# Patient Record
Sex: Female | Born: 1945 | Race: White | Hispanic: No | Marital: Married | Smoking: Never smoker
Health system: Southern US, Community
[De-identification: ages and names within clinical notes are randomized; demographics above are authoritative.]

## PROBLEM LIST (undated history)

## (undated) DIAGNOSIS — I890 Lymphedema, not elsewhere classified: Secondary | ICD-10-CM

## (undated) DIAGNOSIS — I452 Bifascicular block: Secondary | ICD-10-CM

## (undated) DIAGNOSIS — K219 Gastro-esophageal reflux disease without esophagitis: Secondary | ICD-10-CM

## (undated) DIAGNOSIS — M4302 Spondylolysis, cervical region: Secondary | ICD-10-CM

## (undated) DIAGNOSIS — G4730 Sleep apnea, unspecified: Secondary | ICD-10-CM

## (undated) DIAGNOSIS — E669 Obesity, unspecified: Secondary | ICD-10-CM

## (undated) DIAGNOSIS — R51 Headache: Secondary | ICD-10-CM

## (undated) DIAGNOSIS — I447 Left bundle-branch block, unspecified: Secondary | ICD-10-CM

## (undated) DIAGNOSIS — Z87442 Personal history of urinary calculi: Secondary | ICD-10-CM

## (undated) DIAGNOSIS — N2581 Secondary hyperparathyroidism of renal origin: Secondary | ICD-10-CM

## (undated) DIAGNOSIS — H9190 Unspecified hearing loss, unspecified ear: Secondary | ICD-10-CM

## (undated) DIAGNOSIS — I5030 Unspecified diastolic (congestive) heart failure: Secondary | ICD-10-CM

## (undated) DIAGNOSIS — A419 Sepsis, unspecified organism: Secondary | ICD-10-CM

## (undated) DIAGNOSIS — D631 Anemia in chronic kidney disease: Secondary | ICD-10-CM

## (undated) DIAGNOSIS — I10 Essential (primary) hypertension: Secondary | ICD-10-CM

## (undated) DIAGNOSIS — I050 Rheumatic mitral stenosis: Secondary | ICD-10-CM

## (undated) DIAGNOSIS — N390 Urinary tract infection, site not specified: Secondary | ICD-10-CM

## (undated) DIAGNOSIS — E119 Type 2 diabetes mellitus without complications: Secondary | ICD-10-CM

## (undated) DIAGNOSIS — F329 Major depressive disorder, single episode, unspecified: Secondary | ICD-10-CM

## (undated) DIAGNOSIS — Z9841 Cataract extraction status, right eye: Secondary | ICD-10-CM

## (undated) DIAGNOSIS — R519 Headache, unspecified: Secondary | ICD-10-CM

## (undated) DIAGNOSIS — M1990 Unspecified osteoarthritis, unspecified site: Secondary | ICD-10-CM

## (undated) DIAGNOSIS — J45909 Unspecified asthma, uncomplicated: Secondary | ICD-10-CM

## (undated) DIAGNOSIS — F419 Anxiety disorder, unspecified: Secondary | ICD-10-CM

## (undated) DIAGNOSIS — I509 Heart failure, unspecified: Secondary | ICD-10-CM

## (undated) DIAGNOSIS — N184 Chronic kidney disease, stage 4 (severe): Secondary | ICD-10-CM

## (undated) DIAGNOSIS — Z9221 Personal history of antineoplastic chemotherapy: Secondary | ICD-10-CM

## (undated) DIAGNOSIS — C50919 Malignant neoplasm of unspecified site of unspecified female breast: Secondary | ICD-10-CM

## (undated) DIAGNOSIS — F32A Depression, unspecified: Secondary | ICD-10-CM

## (undated) DIAGNOSIS — I700 Atherosclerosis of aorta: Secondary | ICD-10-CM

## (undated) DIAGNOSIS — N1832 Chronic kidney disease, stage 3b: Secondary | ICD-10-CM

## (undated) DIAGNOSIS — R600 Localized edema: Secondary | ICD-10-CM

## (undated) DIAGNOSIS — R6520 Severe sepsis without septic shock: Secondary | ICD-10-CM

## (undated) DIAGNOSIS — N189 Chronic kidney disease, unspecified: Secondary | ICD-10-CM

## (undated) DIAGNOSIS — J3089 Other allergic rhinitis: Secondary | ICD-10-CM

## (undated) HISTORY — PX: REPLACEMENT TOTAL KNEE BILATERAL: SUR1225

## (undated) HISTORY — PX: MASTECTOMY, PARTIAL: SHX709

## (undated) HISTORY — PX: GASTRIC BYPASS: SHX52

## (undated) HISTORY — PX: APPENDECTOMY: SHX54

## (undated) HISTORY — DX: Heart failure, unspecified: I50.9

## (undated) HISTORY — PX: JOINT REPLACEMENT: SHX530

## (undated) HISTORY — PX: EYE SURGERY: SHX253

## (undated) HISTORY — PX: TUBAL LIGATION: SHX77

## (undated) HISTORY — PX: CHOLECYSTECTOMY: SHX55

## (undated) HISTORY — PX: OTHER SURGICAL HISTORY: SHX169

## (undated) HISTORY — DX: Chronic kidney disease, unspecified: N18.9

---

## 2001-02-10 DIAGNOSIS — C50919 Malignant neoplasm of unspecified site of unspecified female breast: Secondary | ICD-10-CM

## 2001-02-10 DIAGNOSIS — Z9221 Personal history of antineoplastic chemotherapy: Secondary | ICD-10-CM

## 2001-02-10 DIAGNOSIS — C50911 Malignant neoplasm of unspecified site of right female breast: Secondary | ICD-10-CM

## 2001-02-10 HISTORY — DX: Malignant neoplasm of unspecified site of right female breast: C50.911

## 2001-02-10 HISTORY — PX: BREAST EXCISIONAL BIOPSY: SUR124

## 2001-02-10 HISTORY — DX: Malignant neoplasm of unspecified site of unspecified female breast: C50.919

## 2001-02-10 HISTORY — DX: Personal history of antineoplastic chemotherapy: Z92.21

## 2003-12-26 ENCOUNTER — Ambulatory Visit: Payer: Self-pay | Admitting: Oncology

## 2004-01-11 ENCOUNTER — Ambulatory Visit: Payer: Self-pay | Admitting: Oncology

## 2004-02-26 ENCOUNTER — Ambulatory Visit: Payer: Self-pay | Admitting: Oncology

## 2004-06-10 DIAGNOSIS — Z9884 Bariatric surgery status: Secondary | ICD-10-CM

## 2004-06-10 HISTORY — DX: Bariatric surgery status: Z98.84

## 2004-06-10 HISTORY — PX: GASTRIC BYPASS: SHX52

## 2004-06-28 ENCOUNTER — Ambulatory Visit: Payer: Self-pay | Admitting: Oncology

## 2004-07-11 ENCOUNTER — Ambulatory Visit: Payer: Self-pay | Admitting: Oncology

## 2004-12-06 ENCOUNTER — Emergency Department: Payer: Self-pay | Admitting: Emergency Medicine

## 2004-12-06 ENCOUNTER — Other Ambulatory Visit: Payer: Self-pay

## 2004-12-23 ENCOUNTER — Ambulatory Visit: Payer: Self-pay | Admitting: Oncology

## 2005-01-09 ENCOUNTER — Encounter: Payer: Self-pay | Admitting: Gynecology

## 2005-01-10 ENCOUNTER — Encounter: Payer: Self-pay | Admitting: Gynecology

## 2005-01-10 ENCOUNTER — Ambulatory Visit: Payer: Self-pay | Admitting: Oncology

## 2005-02-10 ENCOUNTER — Encounter: Payer: Self-pay | Admitting: Gynecology

## 2005-03-03 ENCOUNTER — Ambulatory Visit: Payer: Self-pay | Admitting: Oncology

## 2005-03-13 ENCOUNTER — Encounter: Payer: Self-pay | Admitting: Gynecology

## 2005-04-10 ENCOUNTER — Encounter: Payer: Self-pay | Admitting: Gynecology

## 2005-05-05 ENCOUNTER — Ambulatory Visit: Payer: Self-pay | Admitting: Unknown Physician Specialty

## 2005-05-11 ENCOUNTER — Encounter: Payer: Self-pay | Admitting: Gynecology

## 2005-06-16 ENCOUNTER — Ambulatory Visit: Payer: Self-pay | Admitting: Oncology

## 2005-07-11 ENCOUNTER — Ambulatory Visit: Payer: Self-pay | Admitting: Oncology

## 2005-07-21 ENCOUNTER — Inpatient Hospital Stay: Payer: Self-pay | Admitting: General Practice

## 2005-07-25 ENCOUNTER — Ambulatory Visit: Payer: Self-pay | Admitting: Physical Medicine & Rehabilitation

## 2005-07-25 ENCOUNTER — Inpatient Hospital Stay (HOSPITAL_COMMUNITY)
Admission: RE | Admit: 2005-07-25 | Discharge: 2005-07-31 | Payer: Self-pay | Admitting: Physical Medicine & Rehabilitation

## 2005-12-15 ENCOUNTER — Ambulatory Visit: Payer: Self-pay | Admitting: Oncology

## 2006-01-10 ENCOUNTER — Ambulatory Visit: Payer: Self-pay | Admitting: Oncology

## 2006-03-05 ENCOUNTER — Ambulatory Visit: Payer: Self-pay | Admitting: Oncology

## 2006-06-11 ENCOUNTER — Ambulatory Visit: Payer: Self-pay | Admitting: Oncology

## 2006-06-15 ENCOUNTER — Ambulatory Visit: Payer: Self-pay | Admitting: Oncology

## 2006-07-12 ENCOUNTER — Ambulatory Visit: Payer: Self-pay | Admitting: Oncology

## 2006-12-12 ENCOUNTER — Ambulatory Visit: Payer: Self-pay | Admitting: Oncology

## 2006-12-14 ENCOUNTER — Ambulatory Visit: Payer: Self-pay | Admitting: Oncology

## 2007-01-11 ENCOUNTER — Ambulatory Visit: Payer: Self-pay | Admitting: Oncology

## 2007-02-11 ENCOUNTER — Ambulatory Visit: Payer: Self-pay | Admitting: Oncology

## 2007-03-08 ENCOUNTER — Ambulatory Visit: Payer: Self-pay | Admitting: Oncology

## 2007-03-14 ENCOUNTER — Ambulatory Visit: Payer: Self-pay | Admitting: Emergency Medicine

## 2007-06-03 ENCOUNTER — Ambulatory Visit: Payer: Self-pay | Admitting: Internal Medicine

## 2007-06-11 ENCOUNTER — Ambulatory Visit: Payer: Self-pay | Admitting: Oncology

## 2007-06-14 ENCOUNTER — Ambulatory Visit: Payer: Self-pay | Admitting: Oncology

## 2007-07-12 ENCOUNTER — Ambulatory Visit: Payer: Self-pay | Admitting: Oncology

## 2007-12-13 ENCOUNTER — Ambulatory Visit: Payer: Self-pay | Admitting: Oncology

## 2008-01-11 ENCOUNTER — Ambulatory Visit: Payer: Self-pay | Admitting: Oncology

## 2008-03-08 ENCOUNTER — Ambulatory Visit: Payer: Self-pay | Admitting: Oncology

## 2008-04-28 ENCOUNTER — Ambulatory Visit: Payer: Self-pay | Admitting: Gastroenterology

## 2008-06-10 ENCOUNTER — Ambulatory Visit: Payer: Self-pay | Admitting: Oncology

## 2008-06-12 ENCOUNTER — Ambulatory Visit: Payer: Self-pay | Admitting: Oncology

## 2008-07-11 ENCOUNTER — Ambulatory Visit: Payer: Self-pay | Admitting: Oncology

## 2009-04-04 ENCOUNTER — Ambulatory Visit: Payer: Self-pay

## 2009-04-10 ENCOUNTER — Ambulatory Visit: Payer: Self-pay | Admitting: Oncology

## 2009-04-16 ENCOUNTER — Ambulatory Visit: Payer: Self-pay | Admitting: Oncology

## 2009-05-11 ENCOUNTER — Ambulatory Visit: Payer: Self-pay | Admitting: Oncology

## 2010-04-11 ENCOUNTER — Ambulatory Visit: Payer: Self-pay | Admitting: Oncology

## 2010-06-18 ENCOUNTER — Ambulatory Visit: Payer: Self-pay | Admitting: Oncology

## 2010-07-12 ENCOUNTER — Ambulatory Visit: Payer: Self-pay | Admitting: Oncology

## 2011-04-15 ENCOUNTER — Ambulatory Visit: Payer: Self-pay | Admitting: Oncology

## 2011-06-30 ENCOUNTER — Ambulatory Visit: Payer: Self-pay | Admitting: Oncology

## 2011-07-12 ENCOUNTER — Ambulatory Visit: Payer: Self-pay | Admitting: Oncology

## 2012-03-15 ENCOUNTER — Ambulatory Visit: Payer: Self-pay | Admitting: Otolaryngology

## 2012-06-01 ENCOUNTER — Ambulatory Visit: Payer: Self-pay | Admitting: Internal Medicine

## 2013-06-02 ENCOUNTER — Ambulatory Visit: Payer: Self-pay | Admitting: Family Medicine

## 2014-06-02 NOTE — Op Note (Signed)
PATIENT NAME:  Veronica Graham, Veronica Graham MR#:  [id number] DATE OF BIRTH:  [birth year]  DATE OF PROCEDURE:  03/15/2012  PREOPERATIVE DIAGNOSES: 1.  Septal deviation.  2.  Turbinate enlargement.  3.  Airway obstruction.   POSTOPERATIVE DIAGNOSIS:  1.  Septal deviation.  2.  Turbinate enlargement.  3.  Airway obstruction.   OPERATIVE PROCEDURES: 1.  Septoplasty.  2.  Bilateral outfracture of the inferior turbinates.   SURGEON:  Huey Romans, MD  ANESTHESIA: General.   COMPLICATIONS: None.   DESCRIPTION OF PROCEDURE: The patient was given general anesthesia by oral endotracheal intubation. The nose was visualized. You could see the septum deviated towards the right side posteriorly with very large bony spur obstructing the airway there.  The inferior turbinates were enlarged as well. The area was narrow actually on both sides. The nose was prepped using 6 mL of 1% Xylocaine with epinephrine 1:200,000 for infiltration of the nasal septum and lateral nasal walls. The nose was then packed with Cottonoid pledgets soaked in phenylephrine and Xylocaine as per normal.  She was  prepped and draped in sterile fashion.   The cotton pledgets were removed. The nasal passages were visualized. The inferior turbinates were blocking much of the airway on both sides. A [place] elevator was used for outfracturing the inferior turbinates to increase the airway and open up visualization of the septum better.  This was done bilaterally.  A left [place] incision was created with elevation of mucoperichondrium on the left side of the quadrangular plate. The bony cartilaginous junction was split and some of the posterior inferior quadrangular plate was removed. There was a very large portion of the quadrangular plate that was left intact. Inferiorly, the vomer and maxillary crest was buckled onto the right side. Some of the overhanging cartilage was removed and some of the maxillary crest on the right side was trimmed and  removed that was hanging out. The mucoperiosteum was elevated on both sides of the maxillary crest and vomer and the ethmoid plate.  You can see the very large septal spur to the right side from the vomer. This was fractured and removed as well. The remaining bony tissue was left intact as it seems to be sitting in good position in the nose. The mucosal flaps were placed back in their anatomic position. There was now a nice open airway on both sides of the nose with no evidence for obstruction. The mucosal flaps were sutured in position with a 4-0 plain gut suture in a through and through whipstitch fashion. This was used to close [place] incision as well. There was 1 small tear the mucosa where the large spur was sticking out at the vomer, posteriorly on the right knee.   The area was opened and suture was stuck together with nasal splints using Xomed 0.5 mm regular size splints that were placed on both sides of the nasal septum and held in place with a 3-0 nylon through and through suture. The patient tolerated the procedure well. She was awakened and taken to the recovery room in satisfactory condition. There were no operative complications.    ____________________________ Huey Romans, MD phj:ct D: 03/15/2012 08:54:55 ET T: 03/15/2012 12:03:04 ET JOB#: [id number]  cc: Huey Romans, MD, <Dictator> Huey Romans MD ELECTRONICALLY SIGNED 03/23/2012 8:12

## 2014-06-08 ENCOUNTER — Ambulatory Visit
Admit: 2014-06-08 | Disposition: A | Discharge status: Home / Self Care | Payer: Self-pay | Attending: Family Medicine | Admitting: Family Medicine

## 2015-05-14 ENCOUNTER — Other Ambulatory Visit: Payer: Self-pay | Admitting: Family Medicine

## 2015-05-14 DIAGNOSIS — Z1231 Encounter for screening mammogram for malignant neoplasm of breast: Secondary | ICD-10-CM

## 2015-06-12 ENCOUNTER — Other Ambulatory Visit: Payer: Self-pay | Admitting: Family Medicine

## 2015-06-12 ENCOUNTER — Ambulatory Visit
Admission: RE | Admit: 2015-06-12 | Discharge: 2015-06-12 | Disposition: A | Discharge status: Home / Self Care | Payer: Medicare Other | Source: Ambulatory Visit | Attending: Family Medicine | Admitting: Family Medicine

## 2015-06-12 DIAGNOSIS — Z1231 Encounter for screening mammogram for malignant neoplasm of breast: Secondary | ICD-10-CM | POA: Diagnosis present

## 2015-06-12 HISTORY — DX: Malignant neoplasm of unspecified site of unspecified female breast: C50.919

## 2016-04-29 ENCOUNTER — Other Ambulatory Visit: Payer: Self-pay | Admitting: Family Medicine

## 2016-04-29 DIAGNOSIS — Z1231 Encounter for screening mammogram for malignant neoplasm of breast: Secondary | ICD-10-CM

## 2016-05-13 ENCOUNTER — Ambulatory Visit: Payer: Medicare Other | Attending: Neurology

## 2016-05-13 DIAGNOSIS — G4733 Obstructive sleep apnea (adult) (pediatric): Secondary | ICD-10-CM | POA: Diagnosis present

## 2016-06-12 ENCOUNTER — Ambulatory Visit
Admission: RE | Admit: 2016-06-12 | Discharge: 2016-06-12 | Disposition: A | Discharge status: Home / Self Care | Payer: Medicare Other | Source: Ambulatory Visit | Attending: Family Medicine | Admitting: Family Medicine

## 2016-06-12 DIAGNOSIS — Z1231 Encounter for screening mammogram for malignant neoplasm of breast: Secondary | ICD-10-CM | POA: Diagnosis not present

## 2016-06-27 ENCOUNTER — Other Ambulatory Visit: Payer: Self-pay | Admitting: Physician Assistant

## 2016-06-27 DIAGNOSIS — M5030 Other cervical disc degeneration, unspecified cervical region: Secondary | ICD-10-CM

## 2016-07-10 ENCOUNTER — Ambulatory Visit
Admission: RE | Admit: 2016-07-10 | Discharge: 2016-07-10 | Disposition: A | Discharge status: Home / Self Care | Payer: Medicare Other | Source: Ambulatory Visit | Attending: Physician Assistant | Admitting: Physician Assistant

## 2016-07-10 DIAGNOSIS — M50221 Other cervical disc displacement at C4-C5 level: Secondary | ICD-10-CM | POA: Diagnosis not present

## 2016-07-10 DIAGNOSIS — M4802 Spinal stenosis, cervical region: Secondary | ICD-10-CM | POA: Insufficient documentation

## 2016-07-10 DIAGNOSIS — M5030 Other cervical disc degeneration, unspecified cervical region: Secondary | ICD-10-CM | POA: Insufficient documentation

## 2017-03-25 ENCOUNTER — Encounter: Payer: Self-pay | Admitting: *Deleted

## 2017-03-31 ENCOUNTER — Encounter
Admission: RE | Disposition: A | Discharge status: Home / Self Care | Payer: Self-pay | Source: Ambulatory Visit | Attending: Ophthalmology

## 2017-03-31 ENCOUNTER — Encounter: Payer: Self-pay | Admitting: *Deleted

## 2017-03-31 ENCOUNTER — Ambulatory Visit: Payer: Medicare Other | Admitting: Certified Registered Nurse Anesthetist

## 2017-03-31 ENCOUNTER — Other Ambulatory Visit: Payer: Self-pay

## 2017-03-31 ENCOUNTER — Ambulatory Visit
Admission: RE | Admit: 2017-03-31 | Discharge: 2017-03-31 | Disposition: A | Discharge status: Home / Self Care | Payer: Medicare Other | Source: Ambulatory Visit | Attending: Ophthalmology | Admitting: Ophthalmology

## 2017-03-31 DIAGNOSIS — J45909 Unspecified asthma, uncomplicated: Secondary | ICD-10-CM | POA: Insufficient documentation

## 2017-03-31 DIAGNOSIS — Z7951 Long term (current) use of inhaled steroids: Secondary | ICD-10-CM | POA: Insufficient documentation

## 2017-03-31 DIAGNOSIS — Z9884 Bariatric surgery status: Secondary | ICD-10-CM | POA: Diagnosis not present

## 2017-03-31 DIAGNOSIS — F329 Major depressive disorder, single episode, unspecified: Secondary | ICD-10-CM | POA: Diagnosis not present

## 2017-03-31 DIAGNOSIS — Z96653 Presence of artificial knee joint, bilateral: Secondary | ICD-10-CM | POA: Diagnosis not present

## 2017-03-31 DIAGNOSIS — Z6841 Body Mass Index (BMI) 40.0 and over, adult: Secondary | ICD-10-CM | POA: Diagnosis not present

## 2017-03-31 DIAGNOSIS — Z853 Personal history of malignant neoplasm of breast: Secondary | ICD-10-CM | POA: Insufficient documentation

## 2017-03-31 DIAGNOSIS — H2511 Age-related nuclear cataract, right eye: Secondary | ICD-10-CM | POA: Diagnosis present

## 2017-03-31 DIAGNOSIS — I10 Essential (primary) hypertension: Secondary | ICD-10-CM | POA: Insufficient documentation

## 2017-03-31 DIAGNOSIS — G4730 Sleep apnea, unspecified: Secondary | ICD-10-CM | POA: Diagnosis not present

## 2017-03-31 DIAGNOSIS — Z79899 Other long term (current) drug therapy: Secondary | ICD-10-CM | POA: Insufficient documentation

## 2017-03-31 HISTORY — DX: Sleep apnea, unspecified: G47.30

## 2017-03-31 HISTORY — DX: Localized edema: R60.0

## 2017-03-31 HISTORY — DX: Headache: R51

## 2017-03-31 HISTORY — DX: Headache, unspecified: R51.9

## 2017-03-31 HISTORY — PX: CATARACT EXTRACTION W/PHACO: SHX586

## 2017-03-31 HISTORY — DX: Essential (primary) hypertension: I10

## 2017-03-31 HISTORY — DX: Major depressive disorder, single episode, unspecified: F32.9

## 2017-03-31 HISTORY — DX: Unspecified hearing loss, unspecified ear: H91.90

## 2017-03-31 HISTORY — DX: Depression, unspecified: F32.A

## 2017-03-31 HISTORY — DX: Unspecified asthma, uncomplicated: J45.909

## 2017-03-31 HISTORY — DX: Other allergic rhinitis: J30.89

## 2017-03-31 SURGERY — PHACOEMULSIFICATION, CATARACT, WITH IOL INSERTION
Anesthesia: Monitor Anesthesia Care | Site: Eye | Laterality: Right | Wound class: Clean

## 2017-03-31 MED ORDER — EPINEPHRINE PF 1 MG/ML IJ SOLN
INTRAOCULAR | Status: DC | PRN
  Administered 2017-03-31: 13:00:00 via OPHTHALMIC

## 2017-03-31 MED ORDER — NA CHONDROIT SULF-NA HYALURON 40-17 MG/ML IO SOLN
INTRAOCULAR | Status: AC
Start: 2017-03-31 — End: 2017-03-31
  Filled 2017-03-31: qty 1

## 2017-03-31 MED ORDER — CARBACHOL 0.01 % IO SOLN
INTRAOCULAR | Status: DC | PRN
  Administered 2017-03-31: 0.5 mL via INTRAOCULAR

## 2017-03-31 MED ORDER — LIDOCAINE HCL (PF) 4 % IJ SOLN
INTRAOCULAR | Status: DC | PRN
  Administered 2017-03-31: 4 mL via OPHTHALMIC

## 2017-03-31 MED ORDER — MOXIFLOXACIN HCL 0.5 % OP SOLN: 1.0000 [drp] | OPHTHALMIC | Status: DC | PRN

## 2017-03-31 MED ORDER — MOXIFLOXACIN HCL 0.5 % OP SOLN
OPHTHALMIC | Status: AC
  Filled 2017-03-31: qty 3

## 2017-03-31 MED ORDER — ARMC OPHTHALMIC DILATING DROPS
1.0000 "application " | OPHTHALMIC | Status: AC
  Administered 2017-03-31 (×3): 1 via OPHTHALMIC

## 2017-03-31 MED ORDER — POVIDONE-IODINE 5 % OP SOLN
OPHTHALMIC | Status: AC
Start: 2017-03-31 — End: 2017-03-31
  Filled 2017-03-31: qty 30

## 2017-03-31 MED ORDER — POVIDONE-IODINE 5 % OP SOLN
OPHTHALMIC | Status: DC | PRN
  Administered 2017-03-31: 1 via OPHTHALMIC

## 2017-03-31 MED ORDER — LIDOCAINE HCL (PF) 4 % IJ SOLN
INTRAMUSCULAR | Status: AC
  Filled 2017-03-31: qty 5

## 2017-03-31 MED ORDER — SODIUM CHLORIDE 0.9 % IV SOLN
INTRAVENOUS | Status: DC
  Administered 2017-03-31: 12:00:00 via INTRAVENOUS

## 2017-03-31 MED ORDER — MIDAZOLAM HCL 2 MG/2ML IJ SOLN
INTRAMUSCULAR | Status: DC | PRN
  Administered 2017-03-31: 1 mg via INTRAVENOUS

## 2017-03-31 MED ORDER — ARMC OPHTHALMIC DILATING DROPS
OPHTHALMIC | Status: AC
  Administered 2017-03-31: 1 via OPHTHALMIC
  Filled 2017-03-31: qty 0.4

## 2017-03-31 MED ORDER — MOXIFLOXACIN HCL 0.5 % OP SOLN
OPHTHALMIC | Status: DC | PRN
  Administered 2017-03-31: 0.2 mL via OPHTHALMIC

## 2017-03-31 MED ORDER — NA CHONDROIT SULF-NA HYALURON 40-17 MG/ML IO SOLN
INTRAOCULAR | Status: DC | PRN
  Administered 2017-03-31: 1 mL via INTRAOCULAR

## 2017-03-31 MED ORDER — MIDAZOLAM HCL 2 MG/2ML IJ SOLN
INTRAMUSCULAR | Status: AC
  Filled 2017-03-31: qty 2

## 2017-03-31 MED ORDER — EPINEPHRINE PF 1 MG/ML IJ SOLN
INTRAMUSCULAR | Status: AC
Start: 2017-03-31 — End: 2017-03-31
  Filled 2017-03-31: qty 2

## 2017-03-31 SURGICAL SUPPLY — 16 items
GLOVE BIO SURGEON STRL SZ8 (GLOVE) ×3 IMPLANT
GLOVE BIOGEL M 6.5 STRL (GLOVE) ×3 IMPLANT
GLOVE SURG LX 8.0 MICRO (GLOVE) ×2
GLOVE SURG LX STRL 8.0 MICRO (GLOVE) ×1 IMPLANT
GOWN STRL REUS W/ TWL LRG LVL3 (GOWN DISPOSABLE) ×2 IMPLANT
GOWN STRL REUS W/TWL LRG LVL3 (GOWN DISPOSABLE) ×4
LABEL CATARACT MEDS ST (LABEL) ×3 IMPLANT
LENS IOL TECNIS ITEC 25.5 (Intraocular Lens) ×3 IMPLANT
PACK CATARACT (MISCELLANEOUS) ×3 IMPLANT
PACK CATARACT BRASINGTON LX (MISCELLANEOUS) ×3 IMPLANT
PACK EYE AFTER SURG (MISCELLANEOUS) ×3 IMPLANT
SOL BSS BAG (MISCELLANEOUS) ×3
SOLUTION BSS BAG (MISCELLANEOUS) ×1 IMPLANT
SYR 5ML LL (SYRINGE) ×3 IMPLANT
WATER STERILE IRR 250ML POUR (IV SOLUTION) ×3 IMPLANT
WIPE NON LINTING 3.25X3.25 (MISCELLANEOUS) ×3 IMPLANT

## 2017-03-31 NOTE — Transfer of Care (Signed)
Immediate Anesthesia Transfer of Care Note  Patient: Veronica Graham  Procedure(s) Performed: CATARACT EXTRACTION PHACO AND INTRAOCULAR LENS PLACEMENT (IOC) (Right Eye)  Patient Location: PACU  Anesthesia Type:MAC  Level of Consciousness: awake, alert  and oriented  Airway & Oxygen Therapy: Patient Spontanous Breathing  Post-op Assessment: Report given to RN and Post -op Vital signs reviewed and stable  Post vital signs: Reviewed and stable  Last Vitals:  Vitals:   03/25/17 1004 03/31/17 1136  BP: 137/62 (!) 128/57  Pulse: 64 66  Resp:  16  Temp:  36.6 C  SpO2:  97%    Last Pain:  Vitals:   03/31/17 1136  TempSrc: Temporal         Complications: No apparent anesthesia complications

## 2017-03-31 NOTE — Op Note (Signed)
PREOPERATIVE DIAGNOSIS:  Nuclear sclerotic cataract of the right eye.   POSTOPERATIVE DIAGNOSIS:  NUCLEAR SCLEROTIC CATARACT RIGHT EYE   OPERATIVE PROCEDURE: Procedure(s): CATARACT EXTRACTION PHACO AND INTRAOCULAR LENS PLACEMENT (IOC)   SURGEON:  Birder Robson, MD.   ANESTHESIA:  Anesthesiologist: Alphonsus Sias, MD CRNA: Demetrius Charity, CRNA  1.      Managed anesthesia care. 2.      0.78ml of Shugarcaine was instilled in the eye following the paracentesis.   COMPLICATIONS:  None.   TECHNIQUE:   Stop and chop   DESCRIPTION OF PROCEDURE:  The patient was examined and consented in the preoperative holding area where the aforementioned topical anesthesia was applied to the right eye and then brought back to the Operating Room where the right eye was prepped and draped in the usual sterile ophthalmic fashion and a lid speculum was placed. A paracentesis was created with the side port blade and the anterior chamber was filled with viscoelastic. A near clear corneal incision was performed with the steel keratome. A continuous curvilinear capsulorrhexis was performed with a cystotome followed by the capsulorrhexis forceps. Hydrodissection and hydrodelineation were carried out with BSS on a blunt cannula. The lens was removed in a stop and chop  technique and the remaining cortical material was removed with the irrigation-aspiration handpiece. The capsular bag was inflated with viscoelastic and the Technis ZCB00  lens was placed in the capsular bag without complication. The remaining viscoelastic was removed from the eye with the irrigation-aspiration handpiece. The wounds were hydrated. The anterior chamber was flushed with Miostat and the eye was inflated to physiologic pressure. 0.31ml of Vigamox was placed in the anterior chamber. The wounds were found to be water tight. The eye was dressed with Vigamox. The patient was given protective glasses to wear throughout the day and a shield with which  to sleep tonight. The patient was also given drops with which to begin a drop regimen today and will follow-up with me in one day. Implant Name Type Inv. Item Serial No. Manufacturer Lot No. LRB No. Used  LENS IOL DIOP 25.5 - [id number] Intraocular Lens LENS IOL DIOP 25.5 [id number]  Right 1   Procedure(s) with comments: CATARACT EXTRACTION PHACO AND INTRAOCULAR LENS PLACEMENT (IOC) (Right) - [place] 00:38.2 AP% 18.2 CDE 6.95 Fluid [employer] lot # [id number]  Electronically signed: Birder Robson 03/31/2017 12:47 PM

## 2017-03-31 NOTE — H&P (Signed)
All labs reviewed. Abnormal studies sent to patients PCP when indicated.  Previous H&P reviewed, patient examined, there are NO CHANGES.  Veronica Alvira Porfilio2/19/201912:22 PM

## 2017-03-31 NOTE — Anesthesia Preprocedure Evaluation (Addendum)
Anesthesia Evaluation  Patient identified by MRN, date of birth, ID band Patient awake    Reviewed: Allergy & Precautions, H&P , NPO status , reviewed documented beta blocker date and time   Airway Mallampati: III  TM Distance: >3 FB     Dental  (+) Upper Dentures   Pulmonary asthma , sleep apnea ,     + decreased breath sounds      Cardiovascular hypertension, Normal cardiovascular exam     Neuro/Psych  Headaches, PSYCHIATRIC DISORDERS    GI/Hepatic   Endo/Other  Morbid obesity  Renal/GU      Musculoskeletal   Abdominal   Peds  Hematology   Anesthesia Other Findings Took Xanax PTA  Reproductive/Obstetrics                             Anesthesia Physical Anesthesia Plan  ASA: IV  Anesthesia Plan: MAC   Post-op Pain Management:    Induction:   PONV Risk Score and Plan: 2 and Midazolam  Airway Management Planned:   Additional Equipment:   Intra-op Plan:   Post-operative Plan:   Informed Consent: I have reviewed the patients History and Physical, chart, labs and discussed the procedure including the risks, benefits and alternatives for the proposed anesthesia with the patient or authorized representative who has indicated his/her understanding and acceptance.   Dental Advisory Given  Plan Discussed with: CRNA  Anesthesia Plan Comments:         Anesthesia Quick Evaluation

## 2017-03-31 NOTE — Anesthesia Post-op Follow-up Note (Signed)
Anesthesia QCDR form completed.        

## 2017-03-31 NOTE — Anesthesia Postprocedure Evaluation (Signed)
Anesthesia Post Note  Patient: Veronica Graham  Procedure(s) Performed: CATARACT EXTRACTION PHACO AND INTRAOCULAR LENS PLACEMENT (IOC) (Right Eye)  Patient location during evaluation: PACU Anesthesia Type: MAC Level of consciousness: awake and alert and oriented Pain management: satisfactory to patient Vital Signs Assessment: post-procedure vital signs reviewed and stable Respiratory status: respiratory function stable Cardiovascular status: stable Anesthetic complications: no     Last Vitals:  Vitals:   03/25/17 1004 03/31/17 1136  BP: 137/62 (!) 128/57  Pulse: 64 66  Resp:  16  Temp:  36.6 C  SpO2:  97%    Last Pain:  Vitals:   03/31/17 1136  TempSrc: Temporal                 Blima Singer

## 2017-03-31 NOTE — Discharge Instructions (Signed)
Eye Surgery Discharge Instructions  Expect mild scratchy sensation or mild soreness. DO NOT RUB YOUR EYE!  The day of surgery:  Minimal physical activity, but bed rest is not required  No reading, computer work, or close hand work  No bending, lifting, or straining.  May watch TV  For 24 hours:  No driving, legal decisions, or alcoholic beverages  Safety precautions  Eat anything you prefer: It is better to start with liquids, then soup then solid foods.  _____ Eye patch should be worn until postoperative exam tomorrow.  ____ Solar shield eyeglasses should be worn for comfort in the sunlight/patch while sleeping  Resume all regular medications including aspirin or Coumadin if these were discontinued prior to surgery. You may shower, bathe, shave, or wash your hair. Tylenol may be taken for mild discomfort.  Call your doctor if you experience significant pain, nausea, or vomiting, fever > 101 or other signs of infection. [phone] or [phone] Specific instructions:  Follow-up Information    Birder Robson, MD Follow up on 04/01/2017.   Specialty:  Ophthalmology Why:  at 9:40 for follow up Contact information: [place] [address] [phone]

## 2017-03-31 NOTE — Anesthesia Procedure Notes (Signed)
Procedure Name: MAC Performed by: Jalilah Wiltsie, CRNA Pre-anesthesia Checklist: Patient identified, Emergency Drugs available, Suction available, Patient being monitored and Timeout performed Oxygen Delivery Method: Nasal cannula       

## 2017-04-15 ENCOUNTER — Ambulatory Visit: Admission: RE | Admit: 2017-04-15 | Payer: Medicare Other | Source: Ambulatory Visit | Admitting: Ophthalmology

## 2017-04-15 ENCOUNTER — Encounter: Admission: RE | Payer: Self-pay | Source: Ambulatory Visit

## 2017-04-15 SURGERY — PHACOEMULSIFICATION, CATARACT, WITH IOL INSERTION
Anesthesia: Choice | Laterality: Right

## 2017-04-20 ENCOUNTER — Encounter: Payer: Self-pay | Admitting: *Deleted

## 2017-04-20 NOTE — Anesthesia Pain Management Evaluation Note (Signed)
NO BP,IV,STICKS RIGHT ARM

## 2017-04-20 NOTE — Pre-Procedure Instructions (Signed)
NO BP.IV.STICKS RIGHT ARM

## 2017-04-22 ENCOUNTER — Ambulatory Visit
Admission: RE | Admit: 2017-04-22 | Discharge: 2017-04-22 | Disposition: A | Discharge status: Home / Self Care | Payer: Medicare Other | Source: Ambulatory Visit | Attending: Ophthalmology | Admitting: Ophthalmology

## 2017-04-22 ENCOUNTER — Encounter
Admission: RE | Disposition: A | Discharge status: Home / Self Care | Payer: Self-pay | Source: Ambulatory Visit | Attending: Ophthalmology

## 2017-04-22 ENCOUNTER — Other Ambulatory Visit: Payer: Self-pay

## 2017-04-22 ENCOUNTER — Ambulatory Visit: Payer: Medicare Other | Admitting: Anesthesiology

## 2017-04-22 DIAGNOSIS — F329 Major depressive disorder, single episode, unspecified: Secondary | ICD-10-CM | POA: Insufficient documentation

## 2017-04-22 DIAGNOSIS — Z853 Personal history of malignant neoplasm of breast: Secondary | ICD-10-CM | POA: Insufficient documentation

## 2017-04-22 DIAGNOSIS — I10 Essential (primary) hypertension: Secondary | ICD-10-CM | POA: Diagnosis not present

## 2017-04-22 DIAGNOSIS — G4730 Sleep apnea, unspecified: Secondary | ICD-10-CM | POA: Diagnosis not present

## 2017-04-22 DIAGNOSIS — J45909 Unspecified asthma, uncomplicated: Secondary | ICD-10-CM | POA: Diagnosis not present

## 2017-04-22 DIAGNOSIS — H9190 Unspecified hearing loss, unspecified ear: Secondary | ICD-10-CM | POA: Insufficient documentation

## 2017-04-22 DIAGNOSIS — Z9841 Cataract extraction status, right eye: Secondary | ICD-10-CM | POA: Diagnosis not present

## 2017-04-22 DIAGNOSIS — Z79899 Other long term (current) drug therapy: Secondary | ICD-10-CM | POA: Insufficient documentation

## 2017-04-22 DIAGNOSIS — Z9011 Acquired absence of right breast and nipple: Secondary | ICD-10-CM | POA: Insufficient documentation

## 2017-04-22 DIAGNOSIS — Z9884 Bariatric surgery status: Secondary | ICD-10-CM | POA: Diagnosis not present

## 2017-04-22 DIAGNOSIS — Z7951 Long term (current) use of inhaled steroids: Secondary | ICD-10-CM | POA: Diagnosis not present

## 2017-04-22 DIAGNOSIS — H2512 Age-related nuclear cataract, left eye: Secondary | ICD-10-CM | POA: Insufficient documentation

## 2017-04-22 DIAGNOSIS — R51 Headache: Secondary | ICD-10-CM | POA: Diagnosis not present

## 2017-04-22 DIAGNOSIS — Z96653 Presence of artificial knee joint, bilateral: Secondary | ICD-10-CM | POA: Diagnosis not present

## 2017-04-22 HISTORY — PX: CATARACT EXTRACTION W/PHACO: SHX586

## 2017-04-22 HISTORY — DX: Obesity, unspecified: E66.9

## 2017-04-22 HISTORY — DX: Unspecified osteoarthritis, unspecified site: M19.90

## 2017-04-22 SURGERY — PHACOEMULSIFICATION, CATARACT, WITH IOL INSERTION
Anesthesia: Monitor Anesthesia Care | Site: Eye | Laterality: Left | Wound class: Clean

## 2017-04-22 MED ORDER — ARMC OPHTHALMIC DILATING DROPS
OPHTHALMIC | Status: AC
  Administered 2017-04-22: 1 via OPHTHALMIC
  Filled 2017-04-22: qty 0.4

## 2017-04-22 MED ORDER — MIDAZOLAM HCL 2 MG/2ML IJ SOLN
INTRAMUSCULAR | Status: AC
  Filled 2017-04-22: qty 2

## 2017-04-22 MED ORDER — POVIDONE-IODINE 5 % OP SOLN
OPHTHALMIC | Status: DC | PRN
  Administered 2017-04-22: 1 via OPHTHALMIC

## 2017-04-22 MED ORDER — NA CHONDROIT SULF-NA HYALURON 40-17 MG/ML IO SOLN
INTRAOCULAR | Status: DC | PRN
  Administered 2017-04-22: 1 mL via INTRAOCULAR

## 2017-04-22 MED ORDER — MOXIFLOXACIN HCL 0.5 % OP SOLN
OPHTHALMIC | Status: DC | PRN
  Administered 2017-04-22: 0.2 mL via OPHTHALMIC

## 2017-04-22 MED ORDER — MOXIFLOXACIN HCL 0.5 % OP SOLN
OPHTHALMIC | Status: AC
  Filled 2017-04-22: qty 3

## 2017-04-22 MED ORDER — SODIUM CHLORIDE 0.9 % IV SOLN
INTRAVENOUS | Status: DC
  Administered 2017-04-22 (×2): via INTRAVENOUS

## 2017-04-22 MED ORDER — EPINEPHRINE PF 1 MG/ML IJ SOLN
INTRAMUSCULAR | Status: AC
  Filled 2017-04-22: qty 2

## 2017-04-22 MED ORDER — MOXIFLOXACIN HCL 0.5 % OP SOLN: 1.0000 [drp] | OPHTHALMIC | Status: DC | PRN

## 2017-04-22 MED ORDER — LIDOCAINE HCL (PF) 4 % IJ SOLN
INTRAMUSCULAR | Status: AC
  Filled 2017-04-22: qty 5

## 2017-04-22 MED ORDER — FENTANYL CITRATE (PF) 100 MCG/2ML IJ SOLN
INTRAMUSCULAR | Status: DC | PRN
  Administered 2017-04-22: 50 ug via INTRAVENOUS

## 2017-04-22 MED ORDER — NA CHONDROIT SULF-NA HYALURON 40-17 MG/ML IO SOLN
INTRAOCULAR | Status: AC
  Filled 2017-04-22: qty 1

## 2017-04-22 MED ORDER — LIDOCAINE HCL (PF) 4 % IJ SOLN
INTRAOCULAR | Status: DC | PRN
  Administered 2017-04-22: 4 mL via OPHTHALMIC

## 2017-04-22 MED ORDER — MIDAZOLAM HCL 2 MG/2ML IJ SOLN
INTRAMUSCULAR | Status: DC | PRN
  Administered 2017-04-22: 1 mg via INTRAVENOUS

## 2017-04-22 MED ORDER — EPINEPHRINE PF 1 MG/ML IJ SOLN
INTRAOCULAR | Status: DC | PRN
  Administered 2017-04-22: 09:00:00 via OPHTHALMIC

## 2017-04-22 MED ORDER — POVIDONE-IODINE 5 % OP SOLN
OPHTHALMIC | Status: AC
  Filled 2017-04-22: qty 30

## 2017-04-22 MED ORDER — CARBACHOL 0.01 % IO SOLN
INTRAOCULAR | Status: DC | PRN
  Administered 2017-04-22: 0.5 mL via INTRAOCULAR

## 2017-04-22 MED ORDER — ARMC OPHTHALMIC DILATING DROPS
1.0000 "application " | OPHTHALMIC | Status: AC
  Administered 2017-04-22 (×3): 1 via OPHTHALMIC

## 2017-04-22 MED ORDER — FENTANYL CITRATE (PF) 100 MCG/2ML IJ SOLN
INTRAMUSCULAR | Status: AC
  Filled 2017-04-22: qty 2

## 2017-04-22 SURGICAL SUPPLY — 16 items
GLOVE BIO SURGEON STRL SZ8 (GLOVE) ×3 IMPLANT
GLOVE BIOGEL M 6.5 STRL (GLOVE) ×3 IMPLANT
GLOVE SURG LX 8.0 MICRO (GLOVE) ×2
GLOVE SURG LX STRL 8.0 MICRO (GLOVE) ×1 IMPLANT
GOWN STRL REUS W/ TWL LRG LVL3 (GOWN DISPOSABLE) ×2 IMPLANT
GOWN STRL REUS W/TWL LRG LVL3 (GOWN DISPOSABLE) ×4
LABEL CATARACT MEDS ST (LABEL) ×3 IMPLANT
LENS IOL TECNIS ITEC 25.5 (Intraocular Lens) ×3 IMPLANT
PACK CATARACT (MISCELLANEOUS) ×3 IMPLANT
PACK CATARACT BRASINGTON LX (MISCELLANEOUS) ×3 IMPLANT
PACK EYE AFTER SURG (MISCELLANEOUS) ×3 IMPLANT
SOL BSS BAG (MISCELLANEOUS) ×3
SOLUTION BSS BAG (MISCELLANEOUS) ×1 IMPLANT
SYR 5ML LL (SYRINGE) ×3 IMPLANT
WATER STERILE IRR 250ML POUR (IV SOLUTION) ×3 IMPLANT
WIPE NON LINTING 3.25X3.25 (MISCELLANEOUS) ×3 IMPLANT

## 2017-04-22 NOTE — Discharge Instructions (Signed)
Eye Surgery Discharge Instructions  Expect mild scratchy sensation or mild soreness. DO NOT RUB YOUR EYE!  The day of surgery:  Minimal physical activity, but bed rest is not required  No reading, computer work, or close hand work  No bending, lifting, or straining.  May watch TV  For 24 hours:  No driving, legal decisions, or alcoholic beverages  Safety precautions  Eat anything you prefer: It is better to start with liquids, then soup then solid foods.  _____ Eye patch should be worn until postoperative exam tomorrow.  ____ Solar shield eyeglasses should be worn for comfort in the sunlight/patch while sleeping  Resume all regular medications including aspirin or Coumadin if these were discontinued prior to surgery. You may shower, bathe, shave, or wash your hair. Tylenol may be taken for mild discomfort.  Call your doctor if you experience significant pain, nausea, or vomiting, fever > 101 or other signs of infection. [phone] or [phone] Specific instructions:  Follow-up Information    Birder Robson, MD Follow up.   Specialty:  Ophthalmology Why:  March 14 at 10:25am Contact information: [address] [place] [address] [phone]

## 2017-04-22 NOTE — Transfer of Care (Signed)
Immediate Anesthesia Transfer of Care Note  Patient: Veronica Graham  Procedure(s) Performed: CATARACT EXTRACTION PHACO AND INTRAOCULAR LENS PLACEMENT (IOC) (Left Eye)  Patient Location: PACU  Anesthesia Type:MAC  Level of Consciousness: awake  Airway & Oxygen Therapy: Patient Spontanous Breathing  Post-op Assessment: Report given to RN  Post vital signs: Reviewed and stable  Last Vitals:  Vitals:   04/22/17 0811  BP: 123/60  Pulse: 64  Resp: 18  Temp: 36.7 C  SpO2: 98%    Last Pain:  Vitals:   04/22/17 0811  TempSrc: Oral         Complications: No apparent anesthesia complications

## 2017-04-22 NOTE — Anesthesia Preprocedure Evaluation (Signed)
Anesthesia Evaluation  Patient identified by MRN, date of birth, ID band Patient awake    Reviewed: Allergy & Precautions, H&P , NPO status , reviewed documented beta blocker date and time   Airway Mallampati: III  TM Distance: >3 FB     Dental  (+) Upper Dentures   Pulmonary asthma , sleep apnea ,     + decreased breath sounds      Cardiovascular hypertension, Normal cardiovascular exam     Neuro/Psych  Headaches, PSYCHIATRIC DISORDERS Depression    GI/Hepatic   Endo/Other    Renal/GU      Musculoskeletal   Abdominal   Peds  Hematology   Anesthesia Other Findings   Reproductive/Obstetrics                             Anesthesia Physical Anesthesia Plan  ASA: IV  Anesthesia Plan: MAC   Post-op Pain Management:    Induction:   PONV Risk Score and Plan: 2 and Propofol infusion, Ondansetron and Midazolam  Airway Management Planned:   Additional Equipment:   Intra-op Plan:   Post-operative Plan:   Informed Consent: I have reviewed the patients History and Physical, chart, labs and discussed the procedure including the risks, benefits and alternatives for the proposed anesthesia with the patient or authorized representative who has indicated his/her understanding and acceptance.   Dental Advisory Given  Plan Discussed with: CRNA  Anesthesia Plan Comments:         Anesthesia Quick Evaluation

## 2017-04-22 NOTE — Anesthesia Procedure Notes (Signed)
Procedure Name: MAC Performed by: Katelinn Justice, CRNA Pre-anesthesia Checklist: Patient identified, Emergency Drugs available, Suction available, Patient being monitored and Timeout performed Oxygen Delivery Method: Nasal cannula       

## 2017-04-22 NOTE — Anesthesia Postprocedure Evaluation (Signed)
Anesthesia Post Note  Patient: Veronica Graham  Procedure(s) Performed: CATARACT EXTRACTION PHACO AND INTRAOCULAR LENS PLACEMENT ([place]) (Left Eye)  Patient location during evaluation: Short Stay Anesthesia Type: MAC Level of consciousness: awake Pain management: pain level controlled Vital Signs Assessment: post-procedure vital signs reviewed and stable Respiratory status: spontaneous breathing Cardiovascular status: blood pressure returned to baseline Postop Assessment: no headache Anesthetic complications: no     Last Vitals:  Vitals:   04/22/17 0811  BP: 123/60  Pulse: 64  Resp: 18  Temp: 36.7 C  SpO2: 98%    Last Pain:  Vitals:   04/22/17 0811  TempSrc: Oral                 Buckner Malta

## 2017-04-22 NOTE — Op Note (Signed)
PREOPERATIVE DIAGNOSIS:  Nuclear sclerotic cataract of the left eye.   POSTOPERATIVE DIAGNOSIS:  Nuclear sclerotic cataract of the left eye.   OPERATIVE PROCEDURE: Procedure(s): CATARACT EXTRACTION PHACO AND INTRAOCULAR LENS PLACEMENT (IOC)   SURGEON:  Birder Robson, MD.   ANESTHESIA:  Anesthesiologist: Alphonsus Sias, MD CRNA: Demetrius Charity, CRNA; Allean Found, CRNA  1.      Managed anesthesia care. 2.     0.80ml of Shugarcaine was instilled following the paracentesis   COMPLICATIONS:  None.   TECHNIQUE:   Stop and chop   DESCRIPTION OF PROCEDURE:  The patient was examined and consented in the preoperative holding area where the aforementioned topical anesthesia was applied to the left eye and then brought back to the Operating Room where the left eye was prepped and draped in the usual sterile ophthalmic fashion and a lid speculum was placed. A paracentesis was created with the side port blade and the anterior chamber was filled with viscoelastic. A near clear corneal incision was performed with the steel keratome. A continuous curvilinear capsulorrhexis was performed with a cystotome followed by the capsulorrhexis forceps. Hydrodissection and hydrodelineation were carried out with BSS on a blunt cannula. The lens was removed in a stop and chop  technique and the remaining cortical material was removed with the irrigation-aspiration handpiece. The capsular bag was inflated with viscoelastic and the Technis ZCB00 lens was placed in the capsular bag without complication. The remaining viscoelastic was removed from the eye with the irrigation-aspiration handpiece. The wounds were hydrated. The anterior chamber was flushed with Miostat and the eye was inflated to physiologic pressure. 0.44ml Vigamox was placed in the anterior chamber. The wounds were found to be water tight. The eye was dressed with Vigamox. The patient was given protective glasses to wear throughout the day and a shield  with which to sleep tonight. The patient was also given drops with which to begin a drop regimen today and will follow-up with me in one day. Implant Name Type Inv. Item Serial No. Manufacturer Lot No. LRB No. Used  LENS IOL DIOP 25.5 - [id number] Intraocular Lens LENS IOL DIOP 25.5 [id number]  Left 1  LENS IOL DIOP 25.5 - [id number] Intraocular Lens LENS IOL DIOP 25.5 [id number]  Left 1    Procedure(s) with comments: CATARACT EXTRACTION PHACO AND INTRAOCULAR LENS PLACEMENT (IOC) (Left) - [place] 00:39.0 AP% 12.0 CDE 4.68 Fluid Pack Lot # [id number]  Electronically signed: Birder Robson 04/22/2017 9:19 AM

## 2017-04-22 NOTE — Anesthesia Post-op Follow-up Note (Signed)
Anesthesia QCDR form completed.        

## 2017-04-22 NOTE — H&P (Signed)
All labs reviewed. Abnormal studies sent to patients PCP when indicated.  Previous H&P reviewed, patient examined, there are NO CHANGES.  Veronica Bohne Porfilio3/13/20198:43 AM

## 2017-04-22 NOTE — OR Nursing (Signed)
Discharge instructions discussed with pt and family. Both voice understanding. 

## 2017-05-04 ENCOUNTER — Other Ambulatory Visit: Payer: Self-pay | Admitting: Pediatrics

## 2017-05-04 DIAGNOSIS — Z1231 Encounter for screening mammogram for malignant neoplasm of breast: Secondary | ICD-10-CM

## 2017-07-30 ENCOUNTER — Ambulatory Visit
Admission: RE | Admit: 2017-07-30 | Discharge: 2017-07-30 | Disposition: A | Discharge status: Home / Self Care | Payer: Medicare Other | Source: Ambulatory Visit | Attending: Pediatrics | Admitting: Pediatrics

## 2017-07-30 DIAGNOSIS — Z1231 Encounter for screening mammogram for malignant neoplasm of breast: Secondary | ICD-10-CM

## 2017-07-30 HISTORY — DX: Personal history of antineoplastic chemotherapy: Z92.21

## 2017-09-06 IMAGING — MR MR CERVICAL SPINE W/O CM
5 series · 33 of 48 positions shown · non-contrast
Comparison: None.

CLINICAL DATA: No recent injury. No known surgery. Pain and
stiffness. Bilateral arm pain.

EXAM:
MRI CERVICAL SPINE WITHOUT CONTRAST
TECHNIQUE: Multiplanar, multisequence MR imaging of the cervical spine was
performed. No intravenous contrast was administered.

[Series 2: T2 · sagittal · 3.0mm · 0.56mm/px · 6 of 13 slices shown (1 of 2)]
[im 1/13]
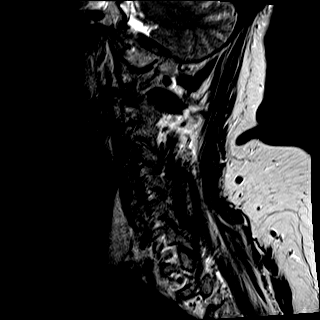
[im 3/13]
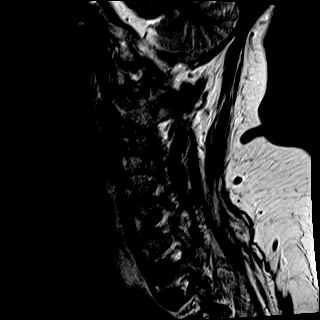
[im 5/13]
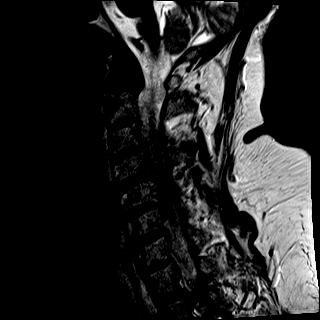
[im 8/13]
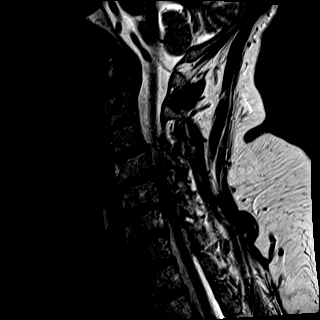
[im 10/13]
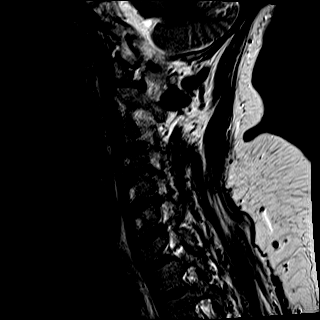
[im 13/13]
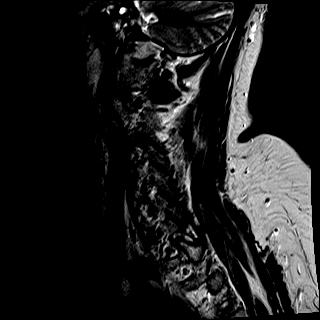

[Series 3: T1 · sagittal · 3.0mm · 0.70mm/px · 7 of 13 slices shown]
[im 1/13]
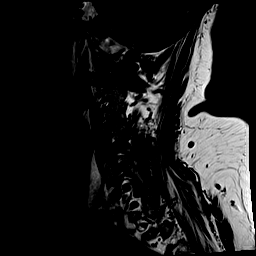
[im 3/13]
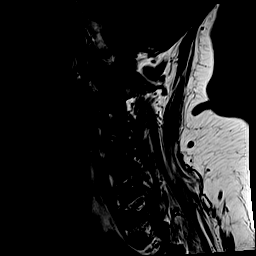
[im 5/13]
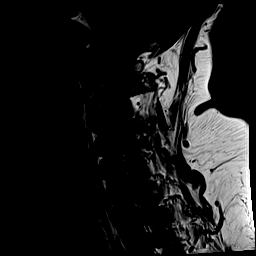
[im 7/13]
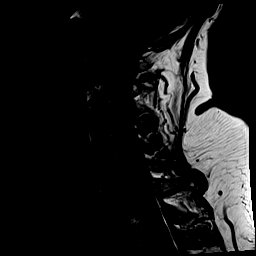
[im 9/13]
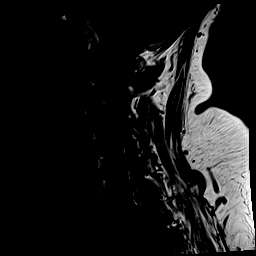
[im 11/13]
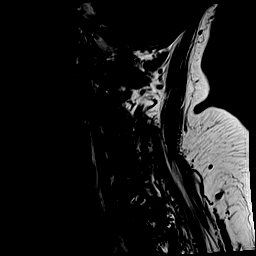
[im 13/13]
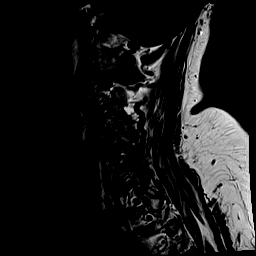

[Series 4: STIR · sagittal · 3.0mm · 0.35mm/px · 7 of 13 slices shown]
[im 1/13]
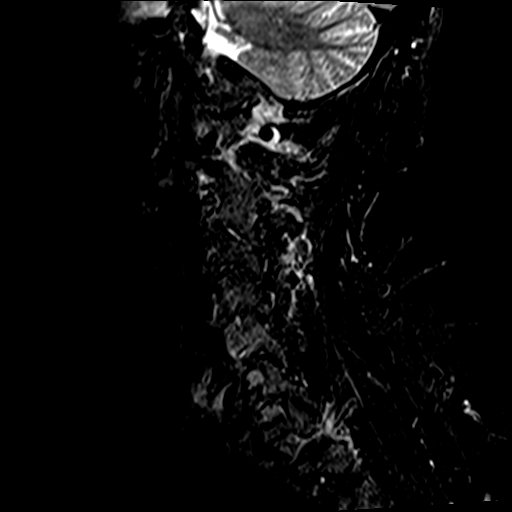
[im 3/13]
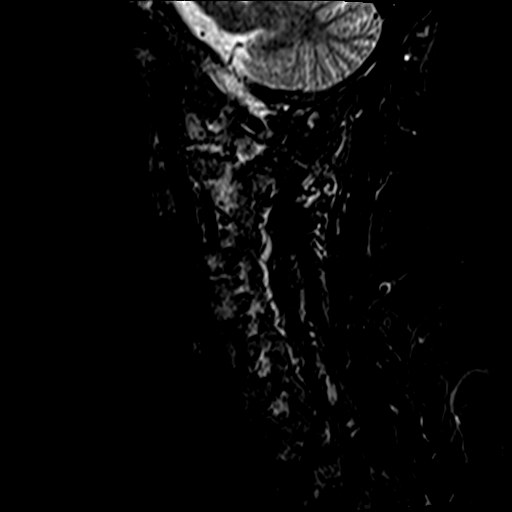
[im 5/13]
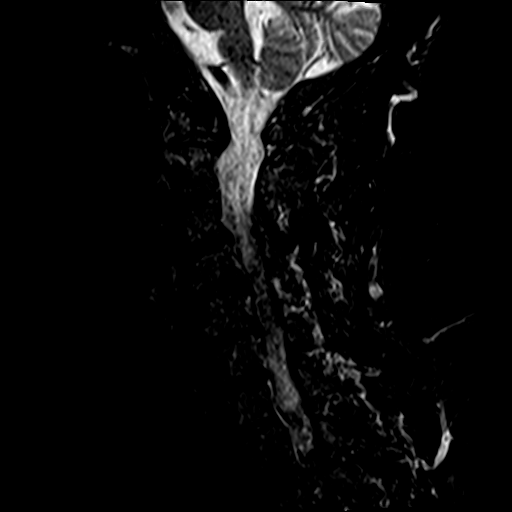
[im 7/13]
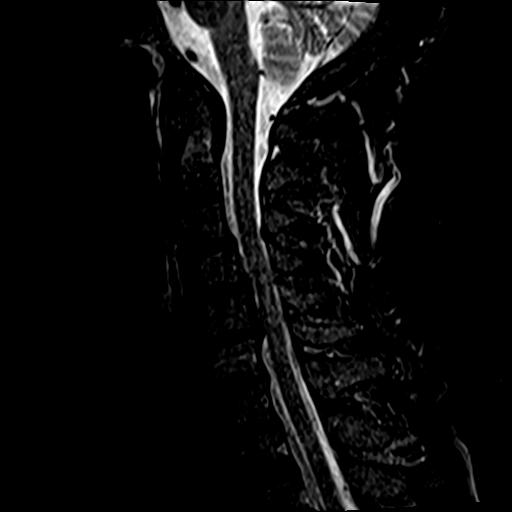
[im 9/13]
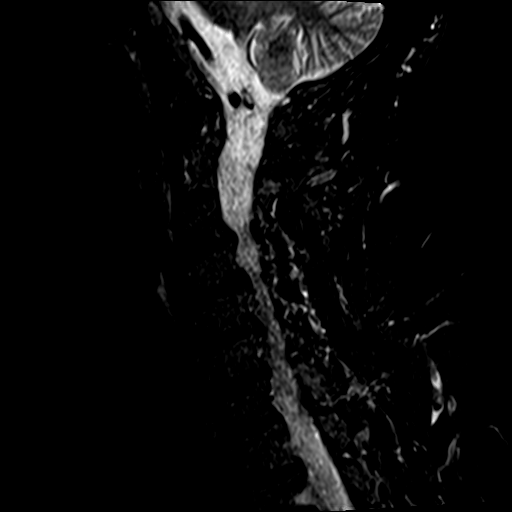
[im 11/13]
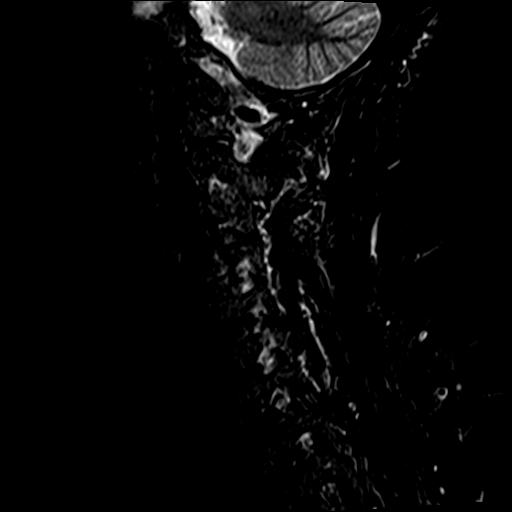
[im 13/13]
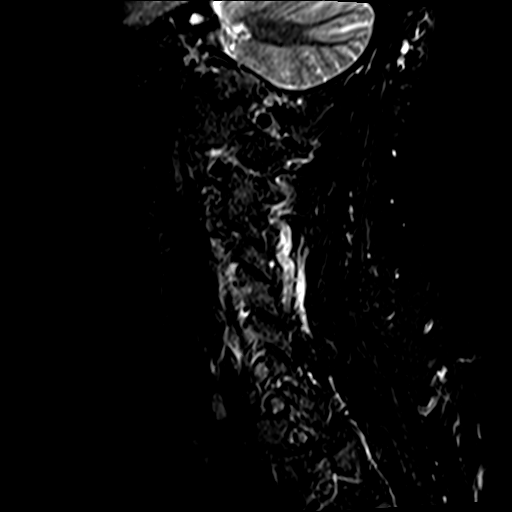

[Series 5: T2 · axial · 3.0mm · 0.62mm/px · z∈[-88,+5]mm · 8 of 26 slices shown (2 of 2)]
[im 1/26]
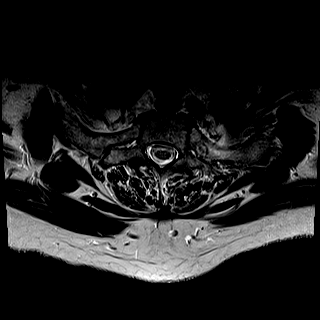
[im 4/26]
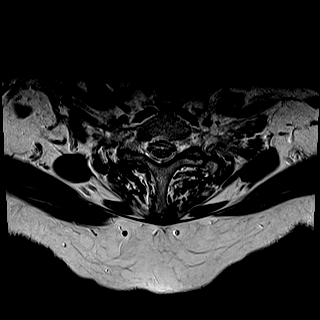
[im 8/26]
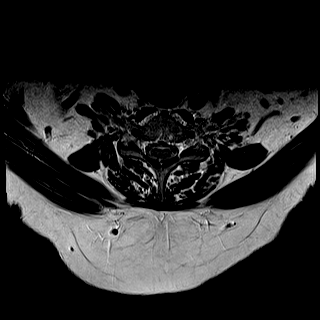
[im 12/26]
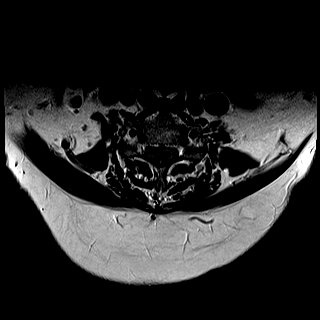
[im 14/26]
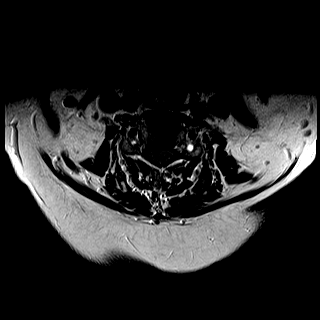
[im 18/26]
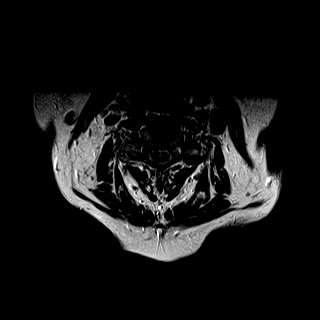
[im 22/26]
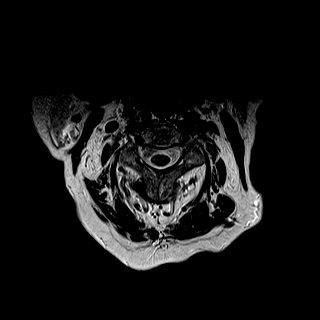
[im 26/26]
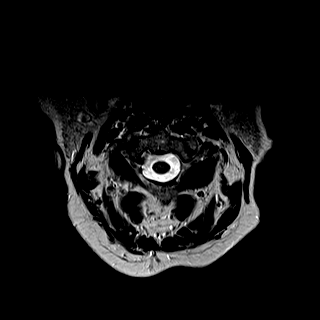

[Series 6: mpgr ax · axial · 3.0mm · 0.37mm/px · z∈[-79,-31]mm · 5 of 26 slices shown]
[im 1/26]
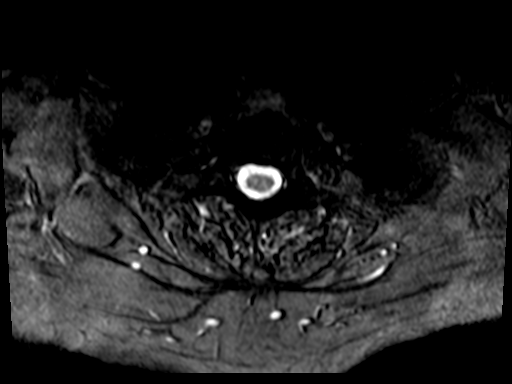
[im 4/26]
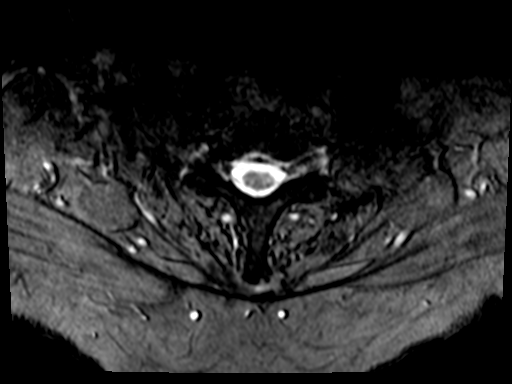
[im 8/26]
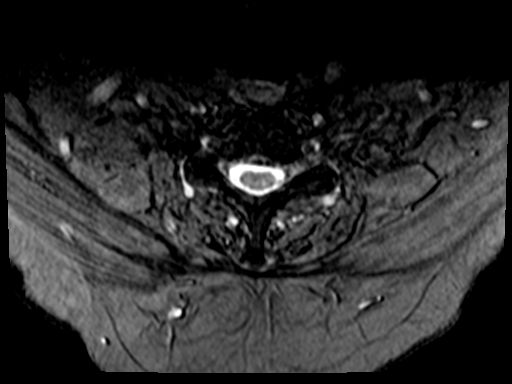
[im 12/26]
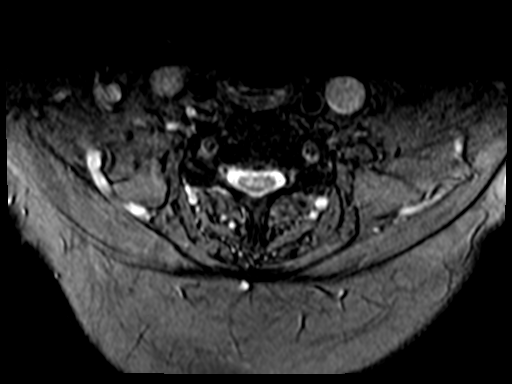
[im 14/26]
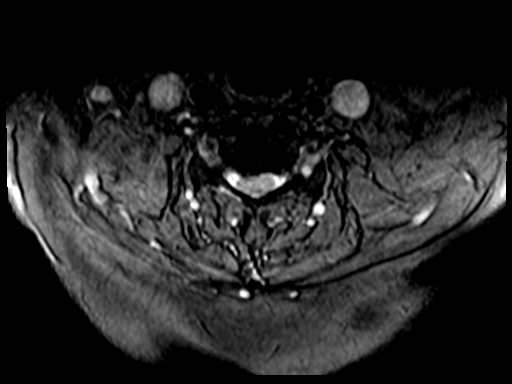

[33 of 48 positions shown; findings below may reference images not displayed]

FINDINGS: Alignment: Physiologic.

Vertebrae: No fracture, evidence of discitis, or bone lesion.

Cord: Normal signal and morphology.

Posterior Fossa, vertebral arteries, paraspinal tissues: Negative.

Disc levels:

Discs: Degenerative disc disease with disc height loss at C4-5, C5-6
and C6-7.

C2-3: No significant disc bulge. No neural foraminal stenosis. No
central canal stenosis.

C3-4: Mild broad-based disc bulge. Moderate left facet arthropathy.
Mild left foraminal stenosis. No central canal stenosis.

C4-5: Mild broad-based disc bulge with a small right paracentral
disc protrusion. Mild -moderate bilateral foraminal stenosis. Mild
spinal stenosis.

C5-6: Shallow broad central disc protrusion. Moderate left and mild
right foraminal stenosis. No central canal stenosis.

C6-7: No significant disc bulge. No neural foraminal stenosis. No
central canal stenosis.

C7-T1: No significant disc bulge. No neural foraminal stenosis. No
central canal stenosis.
IMPRESSION: 1. At C4-5 there is a mild broad-based disc bulge with a small right
paracentral disc protrusion. Mild -moderate bilateral foraminal
stenosis. Mild spinal stenosis.
2. At C3-4 there is a mild broad-based disc bulge. Moderate left
facet arthropathy. Mild left foraminal stenosis.
3. At C5-6 there is a shallow broad central disc protrusion.
Moderate left and mild right foraminal stenosis.

## 2018-07-14 ENCOUNTER — Other Ambulatory Visit: Payer: Self-pay | Admitting: Family Medicine

## 2018-07-14 DIAGNOSIS — Z1231 Encounter for screening mammogram for malignant neoplasm of breast: Secondary | ICD-10-CM

## 2018-08-26 ENCOUNTER — Other Ambulatory Visit: Payer: Self-pay

## 2018-08-26 ENCOUNTER — Ambulatory Visit
Admission: RE | Admit: 2018-08-26 | Discharge: 2018-08-26 | Disposition: A | Discharge status: Home / Self Care | Payer: Medicare Other | Source: Ambulatory Visit | Attending: Family Medicine | Admitting: Family Medicine

## 2018-08-26 DIAGNOSIS — Z1231 Encounter for screening mammogram for malignant neoplasm of breast: Secondary | ICD-10-CM | POA: Diagnosis not present

## 2019-02-03 ENCOUNTER — Other Ambulatory Visit: Payer: Medicare Other

## 2019-02-07 ENCOUNTER — Ambulatory Visit: Payer: Medicare Other | Attending: Internal Medicine

## 2019-02-07 DIAGNOSIS — Z20822 Contact with and (suspected) exposure to covid-19: Secondary | ICD-10-CM

## 2019-02-08 ENCOUNTER — Telehealth: Payer: Self-pay | Admitting: *Deleted

## 2019-02-08 NOTE — Telephone Encounter (Signed)
Patient calling for COVID test results. Pt notified that results are still pending. Understanding verbalized.

## 2019-02-09 LAB — NOVEL CORONAVIRUS, NAA: SARS-CoV-2, NAA: NOT DETECTED

## 2019-03-25 ENCOUNTER — Ambulatory Visit: Payer: Medicare Other | Attending: Internal Medicine

## 2019-03-25 DIAGNOSIS — Z20822 Contact with and (suspected) exposure to covid-19: Secondary | ICD-10-CM

## 2019-03-26 LAB — NOVEL CORONAVIRUS, NAA: SARS-CoV-2, NAA: NOT DETECTED

## 2019-04-15 ENCOUNTER — Other Ambulatory Visit: Payer: Self-pay

## 2019-04-15 ENCOUNTER — Ambulatory Visit: Payer: Medicare Other | Attending: Internal Medicine

## 2019-04-15 DIAGNOSIS — Z23 Encounter for immunization: Secondary | ICD-10-CM | POA: Insufficient documentation

## 2019-04-15 NOTE — Progress Notes (Signed)
   Covid-19 Vaccination Clinic  Name:  Veronica Graham    MRN: [id number] DOB: 23-Nov-1945  04/15/2019  [name] was observed post Covid-19 immunization for 15 minutes without incident. She was provided with Vaccine Information Sheet and instruction to access the V-Safe system.   [name] was instructed to call 911 with any severe reactions post vaccine: Marland Kitchen Difficulty breathing  . Swelling of face and throat  . A fast heartbeat  . A bad rash all over body  . Dizziness and weakness   Immunizations Administered    Name Date Dose VIS Date Route   Pfizer COVID-19 Vaccine 04/15/2019 11:23 AM 0.3 mL 01/21/2019 Intramuscular   Manufacturer: [place]   Lot: [id number]   [place]: [id number]

## 2019-05-06 ENCOUNTER — Ambulatory Visit: Payer: Medicare Other | Attending: Internal Medicine

## 2019-05-06 DIAGNOSIS — Z23 Encounter for immunization: Secondary | ICD-10-CM

## 2019-05-06 NOTE — Progress Notes (Signed)
   Covid-19 Vaccination Clinic  Name:  Veronica Graham    MRN: [id number] DOB: 1945/03/30  05/06/2019  [name] was observed post Covid-19 immunization for 15 minutes without incident. She was provided with Vaccine Information Sheet and instruction to access the V-Safe system.   [name] was instructed to call 911 with any severe reactions post vaccine: Marland Kitchen Difficulty breathing  . Swelling of face and throat  . A fast heartbeat  . A bad rash all over body  . Dizziness and weakness   Immunizations Administered    Name Date Dose VIS Date Route   Pfizer COVID-19 Vaccine 05/06/2019 11:29 AM 0.3 mL 01/21/2019 Intramuscular   Manufacturer: [place]   Lot: [id number]   [place]: [id number]

## 2019-06-28 ENCOUNTER — Other Ambulatory Visit: Payer: Self-pay | Admitting: Family Medicine

## 2019-06-30 ENCOUNTER — Other Ambulatory Visit: Payer: Self-pay | Admitting: Family Medicine

## 2019-06-30 DIAGNOSIS — Z1231 Encounter for screening mammogram for malignant neoplasm of breast: Secondary | ICD-10-CM

## 2019-08-29 ENCOUNTER — Ambulatory Visit
Admission: RE | Admit: 2019-08-29 | Discharge: 2019-08-29 | Disposition: A | Discharge status: Home / Self Care | Payer: Medicare Other | Source: Ambulatory Visit | Attending: Family Medicine | Admitting: Family Medicine

## 2019-08-29 DIAGNOSIS — Z1231 Encounter for screening mammogram for malignant neoplasm of breast: Secondary | ICD-10-CM | POA: Diagnosis present

## 2019-12-13 ENCOUNTER — Other Ambulatory Visit (HOSPITAL_COMMUNITY): Payer: Self-pay | Admitting: Nephrology

## 2019-12-13 ENCOUNTER — Other Ambulatory Visit: Payer: Self-pay | Admitting: Nephrology

## 2019-12-13 DIAGNOSIS — I129 Hypertensive chronic kidney disease with stage 1 through stage 4 chronic kidney disease, or unspecified chronic kidney disease: Secondary | ICD-10-CM

## 2019-12-13 DIAGNOSIS — N184 Chronic kidney disease, stage 4 (severe): Secondary | ICD-10-CM

## 2019-12-13 DIAGNOSIS — D631 Anemia in chronic kidney disease: Secondary | ICD-10-CM

## 2019-12-13 DIAGNOSIS — R808 Other proteinuria: Secondary | ICD-10-CM

## 2019-12-22 ENCOUNTER — Other Ambulatory Visit: Payer: Self-pay

## 2019-12-22 ENCOUNTER — Ambulatory Visit
Admission: RE | Admit: 2019-12-22 | Discharge: 2019-12-22 | Disposition: A | Discharge status: Home / Self Care | Payer: Medicare Other | Source: Ambulatory Visit | Attending: Nephrology | Admitting: Nephrology

## 2019-12-22 DIAGNOSIS — D631 Anemia in chronic kidney disease: Secondary | ICD-10-CM | POA: Diagnosis present

## 2019-12-22 DIAGNOSIS — R808 Other proteinuria: Secondary | ICD-10-CM | POA: Insufficient documentation

## 2019-12-22 DIAGNOSIS — N184 Chronic kidney disease, stage 4 (severe): Secondary | ICD-10-CM | POA: Insufficient documentation

## 2019-12-22 DIAGNOSIS — I129 Hypertensive chronic kidney disease with stage 1 through stage 4 chronic kidney disease, or unspecified chronic kidney disease: Secondary | ICD-10-CM | POA: Diagnosis present

## 2020-02-07 ENCOUNTER — Other Ambulatory Visit: Payer: Self-pay

## 2020-02-07 ENCOUNTER — Ambulatory Visit
Admission: EM | Admit: 2020-02-07 | Discharge: 2020-02-07 | Disposition: A | Discharge status: Home / Self Care | Payer: Medicare Other

## 2020-02-07 DIAGNOSIS — M10379 Gout due to renal impairment, unspecified ankle and foot: Secondary | ICD-10-CM

## 2020-02-07 MED ORDER — PREDNISONE 10 MG (21) PO TBPK: ORAL_TABLET | Freq: Every day | ORAL | 0 refills | Status: DC

## 2020-02-07 MED ORDER — HYDROCODONE-ACETAMINOPHEN 5-325 MG PO TABS: 2.0000 | ORAL_TABLET | ORAL | 0 refills | Status: DC | PRN

## 2020-02-07 NOTE — ED Provider Notes (Signed)
[facility]    CSN: [id number] Arrival date & time: 02/07/20  1455      History   Chief Complaint Chief Complaint  Patient presents with   Foot Pain    HPI Veronica Graham is a 74 y.o. female.   HPI   74 year old female here for evaluation of pain and swelling in her right ankle and left big toe.  She reports that her pain started 2 days ago.  She denies any injury.  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  She typically sees Dr. Vickki Muff and has an upcoming appoint with him on January 10.  The left great toe is red, hot, and swollen.  The right ankle is swollen and tender without redness or warmth.  Denies numbness tingling or weakness.  Past Medical History:  Diagnosis Date   Arthritis    Asthma    Breast cancer ([place]) 2003   right breast   Breast cancer ([place])    Depression    Environmental and seasonal allergies    HOH (hard of hearing)    Hypertension    Lower extremity edema    Obesity    Personal history of chemotherapy 2003   Rt. breast   Sinus headache    Sleep apnea    does not use CPAP    There are no problems to display for this patient.   Past Surgical History:  Procedure Laterality Date   APPENDECTOMY     BREAST EXCISIONAL BIOPSY Right 2003   positve   BTL     CATARACT EXTRACTION W/PHACO Right 03/31/2017   Procedure: CATARACT EXTRACTION PHACO AND INTRAOCULAR LENS PLACEMENT ([place]);  Surgeon: Birder Robson, MD;  Location: [facility] ORS;  Service: Ophthalmology;  Laterality: Right;  [place] 00:38.2 AP% 18.2 CDE 6.95 Fluid Pcak lot # [id number]   CATARACT EXTRACTION W/PHACO Left 04/22/2017   Procedure: CATARACT EXTRACTION PHACO AND INTRAOCULAR LENS PLACEMENT ([facility]);  Surgeon: Birder Robson, MD;  Location: [facility] ORS;  Service: Ophthalmology;  Laterality: Left;  [place] 00:39.0 AP% 12.0 CDE 4.68 Fluid Pack Lot # [id number]   CHOLECYSTECTOMY     GASTRIC BYPASS     JOINT REPLACEMENT      MASTECTOMY, PARTIAL     REPLACEMENT TOTAL KNEE BILATERAL      OB History   No obstetric history on file.      Home Medications    Prior to Admission medications   Medication Sig Start Date End Date Taking? Authorizing Provider  HYDROcodone-acetaminophen (NORCO/VICODIN) 5-325 MG tablet Take 2 tablets by mouth every 4 (four) hours as needed. 02/07/20  Yes Margarette Canada, NP  predniSONE (STERAPRED UNI-PAK 21 TAB) 10 MG (21) TBPK tablet Take by mouth daily. Take 6 tabs by mouth daily  for 2 days, then 5 tabs for 2 days, then 4 tabs for 2 days, then 3 tabs for 2 days, 2 tabs for 2 days, then 1 tab by mouth daily for 2 days 02/07/20  Yes Margarette Canada, NP  valsartan (DIOVAN) 160 MG tablet Take 160 mg by mouth daily. 01/03/20  Yes [provider]  albuterol (PROVENTIL HFA;VENTOLIN HFA) 108 (90 Base) MCG/ACT inhaler Inhale 2 puffs into the lungs every 4 (four) hours as needed for wheezing or shortness of breath.    [provider]  ALPRAZolam Duanne Moron) 0.5 MG tablet Take 0.5 mg by mouth 2 (two) times daily as needed for anxiety (pt takes at bedtime each night).  [provider]  Calcium Carb-Cholecalciferol (CALCIUM+D3) 600-800 MG-UNIT TABS Take 1 tablet by mouth daily.    [provider]  Chlorphen-Pseudoephed-APAP (TYLENOL ALLERGY COMPLETE PO) Take 2 tablets by mouth at bedtime.    [provider]  citalopram (CELEXA) 20 MG tablet Take 40 mg by mouth every morning.    [provider]  fluticasone (FLONASE) 50 MCG/ACT nasal spray Place 2 sprays into both nostrils daily.    [provider]  mometasone (ELOCON) 0.1 % cream Apply 1 application topically daily.    [provider]  montelukast (SINGULAIR) 10 MG tablet Take 10 mg by mouth at bedtime.    [provider]  NON FORMULARY Place 1 drop into the right eye 2 (two) times daily. Imprimis Eye Drop    [provider]  phenyltoloxamine-acetaminophen 30-325 MG  tablet Take 2 tablets by mouth at bedtime.    [provider]  triamcinolone (KENALOG) 0.025 % cream Apply 1 application topically 2 (two) times daily.    [provider]  losartan-hydrochlorothiazide (HYZAAR) 100-25 MG tablet Take 1 tablet by mouth daily.  02/07/20  [provider]    Family History History reviewed. No pertinent family history.  Social History Social History   Tobacco Use   Smoking status: Never Smoker   Smokeless tobacco: Never Used  Substance Use Topics   Alcohol use: No   Drug use: No     Allergies   Other   Review of Systems Review of Systems  Constitutional: Negative for activity change, appetite change and fever.  Musculoskeletal: Positive for joint swelling.  Skin: Positive for color change.  Neurological: Negative for weakness and numbness.  Hematological: Negative.   Psychiatric/Behavioral: Negative.      Physical Exam Triage Vital Signs ED Triage Vitals  Enc Vitals Group     BP      Pulse      Resp      Temp      Temp src      SpO2      Weight      Height      Head Circumference      Peak Flow      Pain Score      Pain Loc      Pain Edu?      Excl. in [place]?    No data found.  Updated Vital Signs BP (!) 113/97 (BP Location: Left Wrist)    Pulse (!) 56    Temp 97.9 F (36.6 C) (Oral)    Resp 20    LMP  (LMP Unknown)    SpO2 100%   Visual Acuity Right Eye Distance:   Left Eye Distance:   Bilateral Distance:    Right Eye Near:   Left Eye Near:    Bilateral Near:     Physical Exam Vitals and nursing note reviewed.  Constitutional:      General: She is not in acute distress.    Appearance: Normal appearance. She is obese. She is not ill-appearing.  Cardiovascular:     Rate and Rhythm: Normal rate and regular rhythm.     Pulses: Normal pulses.     Heart sounds: Normal heart sounds. No murmur heard.   Pulmonary:     Effort: Pulmonary effort is normal.     Breath sounds: Normal breath  sounds. No wheezing, rhonchi or rales.  Musculoskeletal:        General: Swelling and tenderness present. No deformity or signs of  injury.  Skin:    General: Skin is warm.     Capillary Refill: Capillary refill takes less than 2 seconds.     Findings: Erythema present.  Neurological:     General: No focal deficit present.     Mental Status: She is alert and oriented to person, place, and time.  Psychiatric:        Mood and Affect: Mood normal.        Behavior: Behavior normal.        Thought Content: Thought content normal.        Judgment: Judgment normal.      UC Treatments / Results  Labs (all labs ordered are listed, but only abnormal results are displayed) Labs Reviewed - No data to display  EKG   Radiology No results found.  Procedures Procedures (including critical care time)  Medications Ordered in UC Medications - No data to display  Initial Impression / Assessment and Plan / UC Course  I have reviewed the triage vital signs and the nursing notes.  Pertinent labs & imaging results that were available during my care of the patient were reviewed by me and considered in my medical decision making (see chart for details).   Patient is here for evaluation of swelling in her left great toe and her right ankle.  Left great toe has erythema, edema, and tenderness at the MTP joint of the great toe.  Patient has limited flexion extension of her great toe due to the swelling and pain.  This is consistent with gouty presentation.  Right ankle has swelling over the lateral malleolus without erythema or warmth.  It is swollen and tender.  No crepitus appreciated on exam.  Patient has full range of motion of her right ankle.  Suspect this is some arthritis inflammation due to the patient's pending long hours this past weekend on her feet cooking for Christmas.  We will treat both with a steroid Dosepak as patient cannot take NSAIDs due to chronic kidney disease.  Most recent  blood work from January 16, 2020 shows a BUN of 22 and creatinine of 1.5 and a GFR of 34.  She is inquiring about pain medication to help her through the night.  Patient is taken hydrocodone products in the past without difficulty.  Will give a short course of Norco along with a steroid Dosepak.   Final Clinical Impressions(s) / UC Diagnoses   Final diagnoses:  Acute gout due to renal impairment involving toe, unspecified laterality     Discharge Instructions     Take the Norco, 1 tablet every 6 hours, as needed for severe pain.  Start the prednisone Dosepak tomorrow morning and take according to the package instructions.  Increase your water intake so that you help to flush the uric acid from your system that is causing the gout flare.  Keep your appointment with Dr. Vickki Muff on January 10 as scheduled for follow-up.    ED Prescriptions    Medication Sig Dispense Auth. Provider   predniSONE (STERAPRED UNI-PAK 21 TAB) 10 MG (21) TBPK tablet Take by mouth daily. Take 6 tabs by mouth daily  for 2 days, then 5 tabs for 2 days, then 4 tabs for 2 days, then 3 tabs for 2 days, 2 tabs for 2 days, then 1 tab by mouth daily for 2 days 42 tablet Margarette Canada, NP   HYDROcodone-acetaminophen (NORCO/VICODIN) 5-325 MG tablet Take 2 tablets by mouth every 4 (four) hours as needed. 6 tablet  Margarette Canada, NP     I have reviewed the PDMP during this encounter.   Margarette Canada, NP 02/07/20 [phone]

## 2020-02-07 NOTE — ED Triage Notes (Addendum)
Pt c/o pain and swelling to right ankle and left great toe onset Sunday.  Denies injury or trauma to areas, back or muscle pain, fever, n/v/d.  Left great toe with edema, erythema and warmth.  Pt also reports mid abdominal pain onset while in waiting room. Has h/o kidney dysfunction.

## 2020-02-07 NOTE — Discharge Instructions (Addendum)
Take the Norco, 1 tablet every 6 hours, as needed for severe pain.  Start the prednisone Dosepak tomorrow morning and take according to the package instructions.  Increase your water intake so that you help to flush the uric acid from your system that is causing the gout flare.  Keep your appointment with Dr. Vickki Muff on January 10 as scheduled for follow-up.

## 2020-05-11 ENCOUNTER — Other Ambulatory Visit: Payer: Self-pay | Admitting: Nephrology

## 2020-05-11 DIAGNOSIS — N184 Chronic kidney disease, stage 4 (severe): Secondary | ICD-10-CM

## 2020-05-11 DIAGNOSIS — R601 Generalized edema: Secondary | ICD-10-CM

## 2020-05-11 DIAGNOSIS — R809 Proteinuria, unspecified: Secondary | ICD-10-CM

## 2020-05-11 DIAGNOSIS — I129 Hypertensive chronic kidney disease with stage 1 through stage 4 chronic kidney disease, or unspecified chronic kidney disease: Secondary | ICD-10-CM

## 2020-05-11 DIAGNOSIS — N2581 Secondary hyperparathyroidism of renal origin: Secondary | ICD-10-CM

## 2020-05-11 DIAGNOSIS — D631 Anemia in chronic kidney disease: Secondary | ICD-10-CM

## 2020-05-28 ENCOUNTER — Ambulatory Visit
Admission: RE | Admit: 2020-05-28 | Discharge: 2020-05-28 | Disposition: A | Discharge status: Home / Self Care | Payer: Medicare Other | Source: Ambulatory Visit | Attending: Nephrology | Admitting: Nephrology

## 2020-05-28 ENCOUNTER — Other Ambulatory Visit: Payer: Self-pay

## 2020-05-28 DIAGNOSIS — N184 Chronic kidney disease, stage 4 (severe): Secondary | ICD-10-CM | POA: Diagnosis not present

## 2020-05-28 DIAGNOSIS — I129 Hypertensive chronic kidney disease with stage 1 through stage 4 chronic kidney disease, or unspecified chronic kidney disease: Secondary | ICD-10-CM | POA: Diagnosis not present

## 2020-05-28 DIAGNOSIS — I081 Rheumatic disorders of both mitral and tricuspid valves: Secondary | ICD-10-CM | POA: Insufficient documentation

## 2020-05-28 DIAGNOSIS — R601 Generalized edema: Secondary | ICD-10-CM

## 2020-05-28 DIAGNOSIS — N2581 Secondary hyperparathyroidism of renal origin: Secondary | ICD-10-CM | POA: Insufficient documentation

## 2020-05-28 DIAGNOSIS — R809 Proteinuria, unspecified: Secondary | ICD-10-CM | POA: Diagnosis not present

## 2020-05-28 DIAGNOSIS — D631 Anemia in chronic kidney disease: Secondary | ICD-10-CM | POA: Diagnosis not present

## 2020-05-28 LAB — ECHOCARDIOGRAM COMPLETE
AR max vel: 2.6 cm2
AV Area VTI: 2.72 cm2
AV Area mean vel: 3.17 cm2
AV Mean grad: 2.5 mmHg
AV Peak grad: 5.6 mmHg
Ao pk vel: 1.19 m/s
Area-P 1/2: 2.35 cm2
MV VTI: 1.52 cm2
S' Lateral: 3.01 cm

## 2020-05-28 NOTE — Progress Notes (Signed)
*  PRELIMINARY RESULTS* Echocardiogram 2D Echocardiogram has been performed.  Veronica Graham 05/28/2020, 11:03 AM

## 2020-06-12 ENCOUNTER — Other Ambulatory Visit
Admission: RE | Admit: 2020-06-12 | Discharge: 2020-06-12 | Disposition: A | Discharge status: Home / Self Care | Payer: Medicare Other | Source: Ambulatory Visit | Attending: Internal Medicine | Admitting: Internal Medicine

## 2020-06-12 DIAGNOSIS — Z79899 Other long term (current) drug therapy: Secondary | ICD-10-CM | POA: Diagnosis present

## 2020-06-12 DIAGNOSIS — R0602 Shortness of breath: Secondary | ICD-10-CM | POA: Insufficient documentation

## 2020-06-12 DIAGNOSIS — I10 Essential (primary) hypertension: Secondary | ICD-10-CM | POA: Diagnosis present

## 2020-06-12 DIAGNOSIS — R002 Palpitations: Secondary | ICD-10-CM | POA: Diagnosis present

## 2020-06-12 LAB — BRAIN NATRIURETIC PEPTIDE: B Natriuretic Peptide: 67.1 pg/mL (ref 0.0–100.0)

## 2020-07-24 ENCOUNTER — Other Ambulatory Visit: Payer: Self-pay | Admitting: Family Medicine

## 2020-07-24 DIAGNOSIS — Z1231 Encounter for screening mammogram for malignant neoplasm of breast: Secondary | ICD-10-CM

## 2020-08-29 ENCOUNTER — Ambulatory Visit
Admission: RE | Admit: 2020-08-29 | Discharge: 2020-08-29 | Disposition: A | Discharge status: Home / Self Care | Payer: Medicare Other | Source: Ambulatory Visit | Attending: Family Medicine | Admitting: Family Medicine

## 2020-08-29 ENCOUNTER — Other Ambulatory Visit: Payer: Self-pay

## 2020-08-29 DIAGNOSIS — Z1231 Encounter for screening mammogram for malignant neoplasm of breast: Secondary | ICD-10-CM

## 2021-04-11 ENCOUNTER — Emergency Department: Payer: Medicare Other

## 2021-04-11 ENCOUNTER — Emergency Department
Admission: EM | Admit: 2021-04-11 | Discharge: 2021-04-11 | Disposition: A | Discharge status: Home / Self Care | Payer: Medicare Other | Attending: Emergency Medicine | Admitting: Emergency Medicine

## 2021-04-11 ENCOUNTER — Other Ambulatory Visit: Payer: Self-pay

## 2021-04-11 DIAGNOSIS — M545 Low back pain, unspecified: Secondary | ICD-10-CM | POA: Diagnosis not present

## 2021-04-11 DIAGNOSIS — R519 Headache, unspecified: Secondary | ICD-10-CM | POA: Insufficient documentation

## 2021-04-11 DIAGNOSIS — Z853 Personal history of malignant neoplasm of breast: Secondary | ICD-10-CM | POA: Diagnosis not present

## 2021-04-11 DIAGNOSIS — N179 Acute kidney failure, unspecified: Secondary | ICD-10-CM | POA: Insufficient documentation

## 2021-04-11 DIAGNOSIS — I10 Essential (primary) hypertension: Secondary | ICD-10-CM | POA: Insufficient documentation

## 2021-04-11 DIAGNOSIS — K649 Unspecified hemorrhoids: Secondary | ICD-10-CM

## 2021-04-11 DIAGNOSIS — N390 Urinary tract infection, site not specified: Secondary | ICD-10-CM | POA: Insufficient documentation

## 2021-04-11 DIAGNOSIS — N3000 Acute cystitis without hematuria: Secondary | ICD-10-CM | POA: Diagnosis present

## 2021-04-11 DIAGNOSIS — R509 Fever, unspecified: Secondary | ICD-10-CM | POA: Diagnosis not present

## 2021-04-11 DIAGNOSIS — R0981 Nasal congestion: Secondary | ICD-10-CM | POA: Diagnosis not present

## 2021-04-11 DIAGNOSIS — E876 Hypokalemia: Secondary | ICD-10-CM | POA: Diagnosis not present

## 2021-04-11 DIAGNOSIS — Z20822 Contact with and (suspected) exposure to covid-19: Secondary | ICD-10-CM | POA: Diagnosis not present

## 2021-04-11 LAB — CBC WITH DIFFERENTIAL/PLATELET
Abs Immature Granulocytes: 0.06 10*3/uL (ref 0.00–0.07)
Basophils Absolute: 0.1 10*3/uL (ref 0.0–0.1)
Basophils Relative: 1 %
Eosinophils Absolute: 0.2 10*3/uL (ref 0.0–0.5)
Eosinophils Relative: 4 %
HCT: 37.1 % (ref 36.0–46.0)
Hemoglobin: 12.2 g/dL (ref 12.0–15.0)
Immature Granulocytes: 1 %
Lymphocytes Relative: 24 %
Lymphs Abs: 1.6 10*3/uL (ref 0.7–4.0)
MCH: 30.2 pg (ref 26.0–34.0)
MCHC: 32.9 g/dL (ref 30.0–36.0)
MCV: 91.8 fL (ref 80.0–100.0)
Monocytes Absolute: 1.1 10*3/uL — ABNORMAL HIGH (ref 0.1–1.0)
Monocytes Relative: 17 %
Neutro Abs: 3.5 10*3/uL (ref 1.7–7.7)
Neutrophils Relative %: 53 %
Platelets: 173 10*3/uL (ref 150–400)
RBC: 4.04 MIL/uL (ref 3.87–5.11)
RDW: 13.8 % (ref 11.5–15.5)
WBC: 6.5 10*3/uL (ref 4.0–10.5)
nRBC: 0 % (ref 0.0–0.2)

## 2021-04-11 LAB — COMPREHENSIVE METABOLIC PANEL
ALT: 17 U/L (ref 0–44)
AST: 14 U/L — ABNORMAL LOW (ref 15–41)
Albumin: 2.8 g/dL — ABNORMAL LOW (ref 3.5–5.0)
Alkaline Phosphatase: 140 U/L — ABNORMAL HIGH (ref 38–126)
Anion gap: 10 (ref 5–15)
BUN: 31 mg/dL — ABNORMAL HIGH (ref 8–23)
CO2: 26 mmol/L (ref 22–32)
Calcium: 8.7 mg/dL — ABNORMAL LOW (ref 8.9–10.3)
Chloride: 102 mmol/L (ref 98–111)
Creatinine, Ser: 1.83 mg/dL — ABNORMAL HIGH (ref 0.44–1.00)
GFR, Estimated: 29 mL/min — ABNORMAL LOW (ref >60)
Glucose, Bld: 94 mg/dL (ref 70–99)
Potassium: 2.9 mmol/L — ABNORMAL LOW (ref 3.5–5.1)
Sodium: 138 mmol/L (ref 135–145)
Total Bilirubin: 0.4 mg/dL (ref 0.3–1.2)
Total Protein: 6.1 g/dL — ABNORMAL LOW (ref 6.5–8.1)

## 2021-04-11 LAB — URINALYSIS, ROUTINE W REFLEX MICROSCOPIC
Bacteria, UA: NONE SEEN
Bilirubin Urine: NEGATIVE
Glucose, UA: 50 mg/dL — AB
Hgb urine dipstick: NEGATIVE
Ketones, ur: NEGATIVE mg/dL
Nitrite: NEGATIVE
Protein, ur: 30 mg/dL — AB
Specific Gravity, Urine: 1.012 (ref 1.005–1.030)
pH: 5 (ref 5.0–8.0)

## 2021-04-11 LAB — PROCALCITONIN: Procalcitonin: 1.96 ng/mL

## 2021-04-11 LAB — RESP PANEL BY RT-PCR (FLU A&B, COVID) ARPGX2
Influenza A by PCR: NEGATIVE
Influenza B by PCR: NEGATIVE
SARS Coronavirus 2 by RT PCR: NEGATIVE

## 2021-04-11 LAB — LACTIC ACID, PLASMA: Lactic Acid, Venous: 0.7 mmol/L (ref 0.5–1.9)

## 2021-04-11 LAB — MAGNESIUM: Magnesium: 1.7 mg/dL (ref 1.7–2.4)

## 2021-04-11 MED ORDER — HYDROCORTISONE ACETATE 25 MG RE SUPP: 25.0000 mg | Freq: Two times a day (BID) | RECTAL | 0 refills | Status: AC

## 2021-04-11 MED ORDER — CEFUROXIME AXETIL 500 MG PO TABS: 500.0000 mg | ORAL_TABLET | Freq: Two times a day (BID) | ORAL | 0 refills | Status: AC

## 2021-04-11 MED ORDER — SODIUM CHLORIDE 0.9 % IV BOLUS
500.0000 mL | Freq: Once | INTRAVENOUS | Status: AC
Start: 2021-04-11 — End: 2021-04-11
  Administered 2021-04-11: 500 mL via INTRAVENOUS

## 2021-04-11 MED ORDER — POTASSIUM CHLORIDE CRYS ER 20 MEQ PO TBCR: 20.0000 meq | EXTENDED_RELEASE_TABLET | Freq: Every day | ORAL | 0 refills | Status: DC

## 2021-04-11 MED ORDER — POTASSIUM CHLORIDE 10 MEQ/100ML IV SOLN
10.0000 meq | Freq: Once | INTRAVENOUS | Status: AC
  Administered 2021-04-11: 10 meq via INTRAVENOUS
  Filled 2021-04-11: qty 100

## 2021-04-11 MED ORDER — DIBUCAINE (PERIANAL) 1 % EX OINT: 1.0000 "application " | TOPICAL_OINTMENT | CUTANEOUS | 0 refills | Status: AC | PRN

## 2021-04-11 MED ORDER — SODIUM CHLORIDE 0.9 % IV SOLN
2.0000 g | Freq: Once | INTRAVENOUS | Status: AC
  Administered 2021-04-11: 2 g via INTRAVENOUS
  Filled 2021-04-11: qty 20

## 2021-04-11 NOTE — ED Triage Notes (Signed)
Pt here for a UTI. Pt states she was told by her primary to come the ED for an IV to receive abx. Pt denies pain. Pt stable on arrival to ED.  ?

## 2021-04-11 NOTE — Discharge Instructions (Addendum)
We discussed admission but given you are feeling better he felt comfortable going home.   ? ?Stop taking the Macrobid and start the cefuroxime. Take potassium pills for your low potassium.  Call your primary care doctor to get follow-up and recheck of your labs in a few days and if you develop worsening symptoms please return to the ER immediately for repeat evaluation ? ?Keep your phone on use that if your blood cultures are positive we can have you return to the ER. ?

## 2021-04-11 NOTE — ED Provider Notes (Signed)
? ?[facility] ?Provider Note ? ? ? Event Date/Time  ? First MD Initiated Contact with Patient 04/11/21 1601   ?  (approximate) ? ? ?History  ? ?Cystitis ? ? ?HPI ? ?Veronica Graham is a 76 y.o. female with asthma, diabetes who comes in with concern that she needs IV antibiotics.  Patient reports not feeling well for the past few days.  She reports having fever, headaches, back pain, congestion.  She went to her primary care doctor and prescribed her Macrobid for UTI. She was having increased urinary frequency.  She states that they called her this morning and they stated that they were just worried about her and they wanted her to come to the ER to get IV antibiotics.  She states that the fevers have actually already broken and she last took Tylenol this morning was not taking anything recently.  She does report a little bit of lower abdominal discomfort on the left side as well. ? ?I reviewed the records and on February 2023 patient was seen by  internal medicine and was started on Macrobid for 7 days for concern for UTI.  To note patient did have COVID in January.  She had labs done at that time that showed elevated white count of 13 with a left shift.  Flu negative, COVID-negative ? ? ?Physical Exam  ? ?Triage Vital Signs: ?ED Triage Vitals  ?Enc Vitals Group  ?   BP 04/11/21 1512 (!) 151/86  ?   Pulse Rate 04/11/21 1512 65  ?   Resp 04/11/21 1512 17  ?   Temp 04/11/21 1512 97.8 ?F (36.6 ?C)  ?   Temp Source 04/11/21 1512 Oral  ?   SpO2 04/11/21 1512 94 %  ?   Weight 04/11/21 1607 294 lb 15.6 oz (133.8 kg)  ?   Height 04/11/21 1607 5\' 4"  (1.626 m)  ?   Head Circumference --   ?   Peak Flow --   ?   Pain Score 04/11/21 1517 0  ?   Pain Loc --   ?   Pain Edu? --   ?   Excl. in [place]? --   ? ? ?Most recent vital signs: ?Vitals:  ? 04/11/21 1512  ?BP: (!) 151/86  ?Pulse: 65  ?Resp: 17  ?Temp: 97.8 ?F (36.6 ?C)  ?SpO2: 94%  ? ? ? ?General: Awake, no distress.  ?CV:  Good peripheral perfusion.   ?Resp:  Normal effort.  ?Abd:  No distention. Tender low abdominal. No rebound or gaurding. ?Other:  Supple neck, no tenderness  ? ? ?ED Results / Procedures / Treatments  ? ?Labs ?(all labs ordered are listed, but only abnormal results are displayed) ?Labs Reviewed  ?URINALYSIS, ROUTINE W REFLEX MICROSCOPIC - Abnormal; Notable for the following components:  ?    Result Value  ? Color, Urine YELLOW (*)   ? APPearance HAZY (*)   ? Glucose, UA 50 (*)   ? Protein, ur 30 (*)   ? Leukocytes,Ua SMALL (*)   ? All other components within normal limits  ? ? ? ?EKG ? ?My interpretation of EKG: ? ?Normal sinus rate of 64, no st elevation, no twi, normal intervas other then 1st degree AV block.  ? ?RADIOLOGY ?I have reviewed the xray personally and  no pna ?CT pending ? ? ?PROCEDURES: ? ?Critical Care performed: No ? ?Procedures ? ? ?MEDICATIONS ORDERED IN ED: ?Medications  ?sodium chloride 0.9 % bolus 500 mL (0 mLs  Intravenous Stopped 04/11/21 2002)  ?potassium chloride 10 mEq in 100 mL IVPB (0 mEq Intravenous Stopped 04/11/21 2000)  ?cefTRIAXone (ROCEPHIN) 2 g in sodium chloride 0.9 % 100 mL IVPB (0 g Intravenous Stopped 04/11/21 1958)  ? ? ? ?IMPRESSION / MDM / ASSESSMENT AND PLAN / ED COURSE  ?I reviewed the triage vital signs and the nursing notes. ?             ?               ? ?Patient with hypertension, prior breast cancer not currently on chemotherapy or immunosuppressants who comes in with concerns for needing antibiotics per her primary care doctor.  She is not exactly sure why they wanted her to come in but they stated that they were just worried about her.  She reports that her symptoms have actually been getting better.  My differential is very broad and will include UTI, abdominal infection, meningitis given she does report a little bit of headache but has no neck stiffness.  We will get blood work, CT imaging to further evaluate. ? ?COVID, flu are negative.  Her white count is 6 and downtrending from previous.  Her  CMP shows slightly low potassium we will give some IV repletion slightly elevated creatinine but she typically runs at 1.3.  We will give her a little bit of fluid.  Her procalcitonin is elevated but is hard to know if it was even higher before and is now downtrending.  Given her white count is downtrending and patient reports feeling better and I rechecked the temperature and she remains afebrile here after 6 hours I really have low suspicion that she is bacteremic given she otherwise does not meet any other sepsis criteria.  Her blood cultures are pending. ? ?Her CT imaging is reassuring ? ?7:18 PM reevaluated patient given the elevated procalcitonin I considered the possibility of meningitis but this is going on for 5 to 6 days and she reports that her symptoms are getting better.  She is got supple neck no confusion and headaches are very mild and patient is afebrile at this time.  We discussed lumbar puncture but she is opted to hold off at this time given she reports feeling better.  She states that she did have some urinary frequency when the symptoms first started and that she feels like she was just getting better and she is not exactly sure why the clinic told her to come in to be evaluated but they just said that they were worried about her and they wanted her to get an IV.  I discussed the possibility of bacteremia but patient states that she would really like to be able to go home and otherwise she does not meet any other sepsis criteria other than the elevated procalcitonin.  She would prefer to do a dose of IV ceftriaxone and will return to the ER if blood cultures are positive or patient develops worsening symptoms. ? ?We will switch her antibiotic to something stronger such as cefuroxime mean, give her some oral potassium and patient can follow-up closely with her primary care doctor and and she understands that if her symptoms are worsening at all that she needs to return.  She also was requesting  some medications for her hemorrhoids.  I did do a quick rectal exam I do not see any evidence of thrombosed hemorrhoid.  There is no evidence of abscess on CT scan.  Patient still feels comfortable going home  she is tolerating p.o.  Son is at bedside who understands this conversation about possible admission but they again would prefer to go home ? ? ?FINAL CLINICAL IMPRESSION(S) / ED DIAGNOSES  ? ?Final diagnoses:  ?Hypokalemia  ?AKI (acute kidney injury) ([place])  ?Urinary tract infection without hematuria, site unspecified  ? ? ? ?Rx / DC Orders  ? ?ED Discharge Orders   ? ?      Ordered  ?  dibucaine (NUPERCAINAL) 1 % OINT  As needed       ? 04/11/21 2032  ?  hydrocortisone (ANUSOL-HC) 25 MG suppository  Every 12 hours       ? 04/11/21 2032  ?  cefUROXime (CEFTIN) 500 MG tablet  2 times daily with meals       ? 04/11/21 2034  ?  potassium chloride SA (KLOR-CON M) 20 MEQ tablet  Daily       ? 04/11/21 2034  ? ?  ?  ? ?  ? ? ? ?Note:  This document was prepared using Dragon voice recognition software and may include unintentional dictation errors. ?  ?Vanessa Mentone, MD ?04/11/21 2035 ? ?

## 2021-04-12 LAB — RESPIRATORY PANEL BY PCR

## 2021-04-13 LAB — URINE CULTURE: Culture: NO GROWTH

## 2021-04-16 LAB — CULTURE, BLOOD (ROUTINE X 2)
Culture: NO GROWTH
Culture: NO GROWTH
Special Requests: ADEQUATE
Special Requests: ADEQUATE

## 2021-05-04 ENCOUNTER — Other Ambulatory Visit: Payer: Self-pay

## 2021-05-04 ENCOUNTER — Emergency Department: Payer: Medicare Other

## 2021-05-04 ENCOUNTER — Inpatient Hospital Stay
Admission: EM | Admit: 2021-05-04 | Discharge: 2021-05-07 | DRG: 871 | Disposition: A | Discharge status: Home / Self Care | Payer: Medicare Other | Attending: Internal Medicine | Admitting: Internal Medicine

## 2021-05-04 ENCOUNTER — Inpatient Hospital Stay: Payer: Medicare Other

## 2021-05-04 DIAGNOSIS — E872 Acidosis, unspecified: Secondary | ICD-10-CM | POA: Diagnosis present

## 2021-05-04 DIAGNOSIS — F32A Depression, unspecified: Secondary | ICD-10-CM | POA: Diagnosis present

## 2021-05-04 DIAGNOSIS — E860 Dehydration: Secondary | ICD-10-CM | POA: Diagnosis present

## 2021-05-04 DIAGNOSIS — Z96653 Presence of artificial knee joint, bilateral: Secondary | ICD-10-CM | POA: Diagnosis present

## 2021-05-04 DIAGNOSIS — Z853 Personal history of malignant neoplasm of breast: Secondary | ICD-10-CM

## 2021-05-04 DIAGNOSIS — G4733 Obstructive sleep apnea (adult) (pediatric): Secondary | ICD-10-CM | POA: Diagnosis present

## 2021-05-04 DIAGNOSIS — R21 Rash and other nonspecific skin eruption: Secondary | ICD-10-CM | POA: Diagnosis not present

## 2021-05-04 DIAGNOSIS — Z6841 Body Mass Index (BMI) 40.0 and over, adult: Secondary | ICD-10-CM

## 2021-05-04 DIAGNOSIS — R079 Chest pain, unspecified: Secondary | ICD-10-CM | POA: Diagnosis present

## 2021-05-04 DIAGNOSIS — I10 Essential (primary) hypertension: Secondary | ICD-10-CM | POA: Diagnosis present

## 2021-05-04 DIAGNOSIS — A084 Viral intestinal infection, unspecified: Secondary | ICD-10-CM | POA: Diagnosis present

## 2021-05-04 DIAGNOSIS — A419 Sepsis, unspecified organism: Secondary | ICD-10-CM | POA: Diagnosis present

## 2021-05-04 DIAGNOSIS — E1165 Type 2 diabetes mellitus with hyperglycemia: Secondary | ICD-10-CM | POA: Diagnosis present

## 2021-05-04 DIAGNOSIS — I5032 Chronic diastolic (congestive) heart failure: Secondary | ICD-10-CM

## 2021-05-04 DIAGNOSIS — Z8249 Family history of ischemic heart disease and other diseases of the circulatory system: Secondary | ICD-10-CM | POA: Diagnosis not present

## 2021-05-04 DIAGNOSIS — N1831 Chronic kidney disease, stage 3a: Secondary | ICD-10-CM

## 2021-05-04 DIAGNOSIS — N1832 Chronic kidney disease, stage 3b: Secondary | ICD-10-CM | POA: Diagnosis present

## 2021-05-04 DIAGNOSIS — R778 Other specified abnormalities of plasma proteins: Secondary | ICD-10-CM

## 2021-05-04 DIAGNOSIS — F419 Anxiety disorder, unspecified: Secondary | ICD-10-CM | POA: Diagnosis present

## 2021-05-04 DIAGNOSIS — N390 Urinary tract infection, site not specified: Secondary | ICD-10-CM | POA: Diagnosis present

## 2021-05-04 DIAGNOSIS — Z9221 Personal history of antineoplastic chemotherapy: Secondary | ICD-10-CM

## 2021-05-04 DIAGNOSIS — F418 Other specified anxiety disorders: Secondary | ICD-10-CM | POA: Diagnosis present

## 2021-05-04 DIAGNOSIS — R6520 Severe sepsis without septic shock: Secondary | ICD-10-CM | POA: Diagnosis present

## 2021-05-04 DIAGNOSIS — I5033 Acute on chronic diastolic (congestive) heart failure: Secondary | ICD-10-CM | POA: Diagnosis present

## 2021-05-04 DIAGNOSIS — J4520 Mild intermittent asthma, uncomplicated: Secondary | ICD-10-CM

## 2021-05-04 DIAGNOSIS — I248 Other forms of acute ischemic heart disease: Secondary | ICD-10-CM | POA: Diagnosis present

## 2021-05-04 DIAGNOSIS — N3000 Acute cystitis without hematuria: Secondary | ICD-10-CM | POA: Diagnosis not present

## 2021-05-04 DIAGNOSIS — Z20822 Contact with and (suspected) exposure to covid-19: Secondary | ICD-10-CM | POA: Diagnosis present

## 2021-05-04 DIAGNOSIS — E876 Hypokalemia: Secondary | ICD-10-CM | POA: Diagnosis present

## 2021-05-04 DIAGNOSIS — E1122 Type 2 diabetes mellitus with diabetic chronic kidney disease: Secondary | ICD-10-CM | POA: Diagnosis present

## 2021-05-04 DIAGNOSIS — Z9109 Other allergy status, other than to drugs and biological substances: Secondary | ICD-10-CM

## 2021-05-04 DIAGNOSIS — I130 Hypertensive heart and chronic kidney disease with heart failure and stage 1 through stage 4 chronic kidney disease, or unspecified chronic kidney disease: Secondary | ICD-10-CM | POA: Diagnosis present

## 2021-05-04 DIAGNOSIS — B9620 Unspecified Escherichia coli [E. coli] as the cause of diseases classified elsewhere: Secondary | ICD-10-CM | POA: Diagnosis present

## 2021-05-04 DIAGNOSIS — E66813 Obesity, class 3: Secondary | ICD-10-CM | POA: Diagnosis present

## 2021-05-04 DIAGNOSIS — Z825 Family history of asthma and other chronic lower respiratory diseases: Secondary | ICD-10-CM

## 2021-05-04 DIAGNOSIS — R197 Diarrhea, unspecified: Secondary | ICD-10-CM | POA: Diagnosis present

## 2021-05-04 DIAGNOSIS — Z79899 Other long term (current) drug therapy: Secondary | ICD-10-CM

## 2021-05-04 DIAGNOSIS — R7989 Other specified abnormal findings of blood chemistry: Secondary | ICD-10-CM | POA: Diagnosis present

## 2021-05-04 DIAGNOSIS — Z886 Allergy status to analgesic agent status: Secondary | ICD-10-CM

## 2021-05-04 DIAGNOSIS — J45909 Unspecified asthma, uncomplicated: Secondary | ICD-10-CM | POA: Diagnosis present

## 2021-05-04 DIAGNOSIS — M1990 Unspecified osteoarthritis, unspecified site: Secondary | ICD-10-CM | POA: Diagnosis present

## 2021-05-04 DIAGNOSIS — R739 Hyperglycemia, unspecified: Secondary | ICD-10-CM | POA: Diagnosis present

## 2021-05-04 LAB — CBC WITH DIFFERENTIAL/PLATELET
Abs Immature Granulocytes: 0.25 10*3/uL — ABNORMAL HIGH (ref 0.00–0.07)
Basophils Absolute: 0.1 10*3/uL (ref 0.0–0.1)
Basophils Relative: 1 %
Eosinophils Absolute: 0.3 10*3/uL (ref 0.0–0.5)
Eosinophils Relative: 2 %
HCT: 39 % (ref 36.0–46.0)
Hemoglobin: 13 g/dL (ref 12.0–15.0)
Immature Granulocytes: 2 %
Lymphocytes Relative: 5 %
Lymphs Abs: 0.8 10*3/uL (ref 0.7–4.0)
MCH: 30.1 pg (ref 26.0–34.0)
MCHC: 33.3 g/dL (ref 30.0–36.0)
MCV: 90.3 fL (ref 80.0–100.0)
Monocytes Absolute: 0.7 10*3/uL (ref 0.1–1.0)
Monocytes Relative: 4 %
Neutro Abs: 14.5 10*3/uL — ABNORMAL HIGH (ref 1.7–7.7)
Neutrophils Relative %: 86 %
Platelets: 226 10*3/uL (ref 150–400)
RBC: 4.32 MIL/uL (ref 3.87–5.11)
RDW: 14 % (ref 11.5–15.5)
WBC: 16.7 10*3/uL — ABNORMAL HIGH (ref 4.0–10.5)
nRBC: 0 % (ref 0.0–0.2)

## 2021-05-04 LAB — URINALYSIS, COMPLETE (UACMP) WITH MICROSCOPIC
Bilirubin Urine: NEGATIVE
Glucose, UA: >=500 mg/dL — AB
Ketones, ur: NEGATIVE mg/dL
Nitrite: NEGATIVE
Protein, ur: 100 mg/dL — AB
Specific Gravity, Urine: 1.016 (ref 1.005–1.030)
pH: 5 (ref 5.0–8.0)

## 2021-05-04 LAB — COMPREHENSIVE METABOLIC PANEL
ALT: 16 U/L (ref 0–44)
AST: 23 U/L (ref 15–41)
Albumin: 2.9 g/dL — ABNORMAL LOW (ref 3.5–5.0)
Alkaline Phosphatase: 158 U/L — ABNORMAL HIGH (ref 38–126)
Anion gap: 12 (ref 5–15)
BUN: 23 mg/dL (ref 8–23)
CO2: 18 mmol/L — ABNORMAL LOW (ref 22–32)
Calcium: 8.5 mg/dL — ABNORMAL LOW (ref 8.9–10.3)
Chloride: 104 mmol/L (ref 98–111)
Creatinine, Ser: 1.66 mg/dL — ABNORMAL HIGH (ref 0.44–1.00)
GFR, Estimated: 32 mL/min — ABNORMAL LOW (ref >60)
Glucose, Bld: 210 mg/dL — ABNORMAL HIGH (ref 70–99)
Potassium: 2.4 mmol/L — CL (ref 3.5–5.1)
Sodium: 134 mmol/L — ABNORMAL LOW (ref 135–145)
Total Bilirubin: 0.5 mg/dL (ref 0.3–1.2)
Total Protein: 7 g/dL (ref 6.5–8.1)

## 2021-05-04 LAB — LACTIC ACID, PLASMA
Lactic Acid, Venous: 2.5 mmol/L (ref 0.5–1.9)
Lactic Acid, Venous: 1.7 mmol/L (ref 0.5–1.9)

## 2021-05-04 LAB — HEMOGLOBIN A1C
Hgb A1c MFr Bld: 5.4 % (ref 4.8–5.6)
Mean Plasma Glucose: 108.28 mg/dL

## 2021-05-04 LAB — TROPONIN I (HIGH SENSITIVITY)
Troponin I (High Sensitivity): 44 ng/L — ABNORMAL HIGH (ref <18)
Troponin I (High Sensitivity): 36 ng/L — ABNORMAL HIGH (ref <18)
Troponin I (High Sensitivity): 36 ng/L — ABNORMAL HIGH (ref <18)

## 2021-05-04 LAB — RESP PANEL BY RT-PCR (FLU A&B, COVID) ARPGX2
Influenza A by PCR: NEGATIVE
Influenza B by PCR: NEGATIVE
SARS Coronavirus 2 by RT PCR: NEGATIVE

## 2021-05-04 LAB — PROTIME-INR
INR: 1.2 (ref 0.8–1.2)
Prothrombin Time: 14.7 seconds (ref 11.4–15.2)

## 2021-05-04 LAB — BRAIN NATRIURETIC PEPTIDE: B Natriuretic Peptide: 546.3 pg/mL — ABNORMAL HIGH (ref 0.0–100.0)

## 2021-05-04 LAB — PHOSPHORUS: Phosphorus: 2.4 mg/dL — ABNORMAL LOW (ref 2.5–4.6)

## 2021-05-04 LAB — MAGNESIUM: Magnesium: 1 mg/dL — ABNORMAL LOW (ref 1.7–2.4)

## 2021-05-04 LAB — PROCALCITONIN: Procalcitonin: 1.82 ng/mL

## 2021-05-04 LAB — APTT: aPTT: 32 seconds (ref 24–36)

## 2021-05-04 MED ORDER — POTASSIUM CHLORIDE CRYS ER 20 MEQ PO TBCR
40.0000 meq | EXTENDED_RELEASE_TABLET | Freq: Once | ORAL | Status: AC
  Administered 2021-05-04: 40 meq via ORAL
  Filled 2021-05-04: qty 2

## 2021-05-04 MED ORDER — HYDRALAZINE HCL 20 MG/ML IJ SOLN: 5.0000 mg | INTRAMUSCULAR | Status: DC | PRN

## 2021-05-04 MED ORDER — VENLAFAXINE HCL ER 75 MG PO CP24
150.0000 mg | ORAL_CAPSULE | Freq: Every day | ORAL | Status: DC
  Administered 2021-05-05 – 2021-05-07 (×3): 150 mg via ORAL
  Filled 2021-05-04 (×3): qty 2

## 2021-05-04 MED ORDER — ALPRAZOLAM 0.5 MG PO TABS: 0.5000 mg | ORAL_TABLET | Freq: Two times a day (BID) | ORAL | Status: DC | PRN

## 2021-05-04 MED ORDER — ALBUTEROL SULFATE (2.5 MG/3ML) 0.083% IN NEBU: 2.5000 mg | INHALATION_SOLUTION | RESPIRATORY_TRACT | Status: DC | PRN

## 2021-05-04 MED ORDER — SODIUM CHLORIDE 0.9 % IV SOLN
2.0000 g | INTRAVENOUS | Status: DC
  Administered 2021-05-05: 2 g via INTRAVENOUS
  Filled 2021-05-04: qty 20
  Filled 2021-05-04: qty 2

## 2021-05-04 MED ORDER — SODIUM CHLORIDE 0.9 % IV SOLN: INTRAVENOUS | Status: DC

## 2021-05-04 MED ORDER — ALBUTEROL SULFATE (2.5 MG/3ML) 0.083% IN NEBU
3.0000 mL | INHALATION_SOLUTION | RESPIRATORY_TRACT | Status: DC | PRN
  Administered 2021-05-04: 3 mL via RESPIRATORY_TRACT
  Filled 2021-05-04: qty 3

## 2021-05-04 MED ORDER — MAGNESIUM SULFATE 4 GM/100ML IV SOLN
4.0000 g | Freq: Once | INTRAVENOUS | Status: AC
  Administered 2021-05-04: 4 g via INTRAVENOUS
  Filled 2021-05-04: qty 100

## 2021-05-04 MED ORDER — MONTELUKAST SODIUM 10 MG PO TABS
10.0000 mg | ORAL_TABLET | Freq: Every day | ORAL | Status: DC
  Administered 2021-05-04 – 2021-05-06 (×3): 10 mg via ORAL
  Filled 2021-05-04 (×3): qty 1

## 2021-05-04 MED ORDER — CITALOPRAM HYDROBROMIDE 20 MG PO TABS
40.0000 mg | ORAL_TABLET | ORAL | Status: DC
  Administered 2021-05-05 – 2021-05-07 (×3): 40 mg via ORAL
  Filled 2021-05-04 (×3): qty 2

## 2021-05-04 MED ORDER — MORPHINE SULFATE (PF) 2 MG/ML IV SOLN: 1.0000 mg | INTRAVENOUS | Status: DC | PRN

## 2021-05-04 MED ORDER — SODIUM CHLORIDE 0.9 % IV BOLUS
1000.0000 mL | Freq: Once | INTRAVENOUS | Status: AC
  Administered 2021-05-04: 1000 mL via INTRAVENOUS

## 2021-05-04 MED ORDER — SODIUM CHLORIDE 0.9 % IV SOLN
1.0000 g | Freq: Once | INTRAVENOUS | Status: AC
  Administered 2021-05-04: 1 g via INTRAVENOUS
  Filled 2021-05-04: qty 10

## 2021-05-04 MED ORDER — OYSTER SHELL CALCIUM/D3 500-5 MG-MCG PO TABS
1.0000 | ORAL_TABLET | Freq: Every day | ORAL | Status: DC
  Administered 2021-05-05 – 2021-05-07 (×3): 1 via ORAL
  Filled 2021-05-04 (×3): qty 1

## 2021-05-04 MED ORDER — CALCITRIOL 0.25 MCG PO CAPS
0.2500 ug | ORAL_CAPSULE | Freq: Every day | ORAL | Status: DC
  Administered 2021-05-05 – 2021-05-07 (×3): 0.25 ug via ORAL
  Filled 2021-05-04 (×3): qty 1

## 2021-05-04 MED ORDER — DM-GUAIFENESIN ER 30-600 MG PO TB12
1.0000 | ORAL_TABLET | Freq: Two times a day (BID) | ORAL | Status: DC | PRN
  Administered 2021-05-06 – 2021-05-07 (×2): 1 via ORAL
  Filled 2021-05-04 (×2): qty 1

## 2021-05-04 MED ORDER — ACETAMINOPHEN 325 MG PO TABS
650.0000 mg | ORAL_TABLET | Freq: Four times a day (QID) | ORAL | Status: DC | PRN
Start: 2021-05-04 — End: 2021-05-07
  Administered 2021-05-04 – 2021-05-06 (×3): 650 mg via ORAL
  Filled 2021-05-04 (×3): qty 2

## 2021-05-04 MED ORDER — POTASSIUM CHLORIDE 10 MEQ/100ML IV SOLN
10.0000 meq | Freq: Once | INTRAVENOUS | Status: AC
Start: 2021-05-04 — End: 2021-05-04
  Administered 2021-05-04: 10 meq via INTRAVENOUS
  Filled 2021-05-04: qty 100

## 2021-05-04 MED ORDER — ASPIRIN EC 81 MG PO TBEC
81.0000 mg | DELAYED_RELEASE_TABLET | Freq: Every day | ORAL | Status: DC
Start: 2021-05-04 — End: 2021-05-07
  Administered 2021-05-04 – 2021-05-07 (×4): 81 mg via ORAL
  Filled 2021-05-04 (×4): qty 1

## 2021-05-04 MED ORDER — ENOXAPARIN SODIUM 60 MG/0.6ML IJ SOSY
60.0000 mg | PREFILLED_SYRINGE | INTRAMUSCULAR | Status: DC
  Administered 2021-05-04: 60 mg via SUBCUTANEOUS
  Filled 2021-05-04: qty 0.6

## 2021-05-04 MED ORDER — ONDANSETRON HCL 4 MG/2ML IJ SOLN
4.0000 mg | Freq: Three times a day (TID) | INTRAMUSCULAR | Status: DC | PRN
Start: 2021-05-04 — End: 2021-05-07

## 2021-05-04 MED ORDER — NITROGLYCERIN 0.4 MG SL SUBL: 0.4000 mg | SUBLINGUAL_TABLET | SUBLINGUAL | Status: DC | PRN

## 2021-05-04 MED ORDER — PANTOPRAZOLE SODIUM 40 MG PO TBEC
40.0000 mg | DELAYED_RELEASE_TABLET | Freq: Every day | ORAL | Status: DC
Start: 2021-05-04 — End: 2021-05-07
  Administered 2021-05-04 – 2021-05-07 (×4): 40 mg via ORAL
  Filled 2021-05-04 (×4): qty 1

## 2021-05-04 MED ORDER — SODIUM CHLORIDE 0.9 % IV BOLUS
500.0000 mL | Freq: Once | INTRAVENOUS | Status: AC
  Administered 2021-05-04: 500 mL via INTRAVENOUS

## 2021-05-04 MED ORDER — CARVEDILOL 3.125 MG PO TABS
3.1250 mg | ORAL_TABLET | Freq: Two times a day (BID) | ORAL | Status: DC
  Administered 2021-05-04 – 2021-05-07 (×6): 3.125 mg via ORAL
  Filled 2021-05-04 (×6): qty 1

## 2021-05-04 NOTE — ED Triage Notes (Signed)
Pt comes pov with fever since yesterday as high as 102 and chest pain starting this morning. Small cough. N/v/d.  ?

## 2021-05-04 NOTE — ED Notes (Signed)
Patient ambulatory with assistance to bedside toilet.  ?

## 2021-05-04 NOTE — Progress Notes (Addendum)
PHARMACIST - PHYSICIAN COMMUNICATION ? ?CONCERNING:  Enoxaparin (Lovenox) for DVT Prophylaxis  ? ? ?RECOMMENDATION: ?Patient was prescribed enoxaprin '40mg'$  q24 hours for VTE prophylaxis.  ? ?Filed Weights  ? 05/04/21 1045  ?Weight: 113.4 kg (250 lb)  ? ? ?Body mass index is 42.91 kg/m?. ? ?Estimated Creatinine Clearance: 36.1 mL/min (A) (by C-G formula based on SCr of 1.66 mg/dL (H)). ? ? ?Based on [place] patient is candidate for enoxaparin 0.'5mg'$ /kg TBW SQ every 24 hours based on BMI being >30. ? ? ?DESCRIPTION: ?Pharmacy has adjusted enoxaparin dose per [facility] policy. ? ?Patient is now receiving enoxaparin 60 mg every 24 hours  ? ? ?Wynelle Cleveland, PharmD ?Pharmacy Resident  ?05/04/2021 ?1:45 PM ? ? ?

## 2021-05-04 NOTE — ED Notes (Signed)
Pt adjusted in bed

## 2021-05-04 NOTE — H&P (Signed)
?History and Physical  ? ? ?[name]:[id number] DOB: 10-18-1945 DOA: 05/04/2021 ? ?Referring MD/NP/PA:  ? ?PCP: Derinda Late, MD  ? ?Patient coming from:  The patient is coming from home.  At baseline, pt is independent for most of ADL.       ? ?Chief Complaint: fever, increased urinary frequency, diarrhea ? ?HPI: Veronica Graham is a 76 y.o. female with medical history significant of hypertension, asthma, depression with anxiety, OSA not on CPAP, obesity with BMI 42.9, hard of hearing, breast cancer (s/p for chemotherapy), CKD-3A, dCHF, who presents with fever, increased urinary frequency and diarrhea. ? ?Patient states that she has fever for more than 2 days, her temperature was 102 at home today.  Patient has sore throat, nasal congestion, no cough, shortness of breath.  She has chest tightness, denies active chest pain.  Patient also has diarrhea in the past 3 days.  She has more than 5 times of watery diarrhea each day.  She has nausea, dry dry heaves and mild discomfort in lower abdomen.  Patient has increased urinary frequency which she attributed to water pill use, no dysuria or burning with urination. ? ? ?Data Reviewed and ED Course: pt was found to have WBC 16.7, lactic acid 2.5, INR 1.2, PTT 32, troponin level 44, negative COVID PCR, positive urinalysis (cloudy appearance, moderate amount of leukocyte, many bacteria, WBC 21-50), renal function close to recent baseline, temperature 99.1, blood pressure 138/75, heart rate 110, RR 22, oxygen saturation 98% on room air.  Chest x-ray negative.  Patient is admitted to telemetry bed as inpatient. ? ? ?EKG: I have personally reviewed.  Sinus rhythm, QTc 454, poor R wave progression, frequent PVC. ? ? ?Review of Systems:  ? ?General: has fevers, chills, no body weight gain, has poor appetite, has fatigue ?HEENT: no blurry vision. Has sore throat ?Respiratory: no dyspnea, coughing, wheezing ?CV: has chest tightness no palpitations ?GI: Has nausea, dry  heaves, lower abdominal discomfort, diarrhea. ?GU: no dysuria, burning on urination, increased urinary frequency, hematuria  ?Ext: Trace leg edema ?Neuro: no unilateral weakness, numbness, or tingling, no vision change or hearing loss ?Skin: no rash, no skin tear. ?MSK: No muscle spasm, no deformity, no limitation of range of movement in spin ?Heme: No easy bruising.  ?Travel history: No recent long distant travel. ? ? ?Allergy:  ?Allergies  ?Allergen Reactions  ? Other Other (See Comments)  ?  DO NOT USE BLOOD PRESSURE CUFF OR NEEDLES (IVs) in RIGHT ARM  ? ? ?Past Medical History:  ?Diagnosis Date  ? Arthritis   ? Asthma   ? Breast cancer ([place]) 2003  ? right breast  ? Breast cancer ([place])   ? Depression   ? Environmental and seasonal allergies   ? HOH (hard of hearing)   ? Hypertension   ? Lower extremity edema   ? Obesity   ? Personal history of chemotherapy 2003  ? Rt. breast  ? Sinus headache   ? Sleep apnea   ? does not use CPAP  ? ? ?Past Surgical History:  ?Procedure Laterality Date  ? APPENDECTOMY    ? BREAST EXCISIONAL BIOPSY Right 2003  ? positve  ? BTL    ? CATARACT EXTRACTION W/PHACO Right 03/31/2017  ? Procedure: CATARACT EXTRACTION PHACO AND INTRAOCULAR LENS PLACEMENT (IOC);  Surgeon: Birder Robson, MD;  Location: [facility] ORS;  Service: Ophthalmology;  Laterality: Right;  [place] 00:38.2 ?AP% 18.2 ?CDE 6.95 ?Fluid Pcak lot # [id number]  ? CATARACT EXTRACTION  W/PHACO Left 04/22/2017  ? Procedure: CATARACT EXTRACTION PHACO AND INTRAOCULAR LENS PLACEMENT (IOC);  Surgeon: Birder Robson, MD;  Location: [facility] ORS;  Service: Ophthalmology;  Laterality: Left;  [place] 00:39.0 ?AP% 12.0 ?CDE 4.68 ?Fluid Pack Lot # [id number]  ? CHOLECYSTECTOMY    ? GASTRIC BYPASS    ? JOINT REPLACEMENT    ? MASTECTOMY, PARTIAL    ? REPLACEMENT TOTAL KNEE BILATERAL    ? ? ?Social History:  reports that she has never smoked. She has never used smokeless tobacco. She reports that she does not drink alcohol and does not use drugs. ? ?Family  History:  ?Family History  ?Problem Relation Age of Onset  ? Heart failure Mother   ? COPD Father   ? CAD Brother   ?  ? ?Prior to Admission medications   ?Medication Sig Start Date End Date Taking? Authorizing Provider  ?albuterol (PROVENTIL HFA;VENTOLIN HFA) 108 (90 Base) MCG/ACT inhaler Inhale 2 puffs into the lungs every 4 (four) hours as needed for wheezing or shortness of breath.    [provider]  ?ALPRAZolam Duanne Moron) 0.5 MG tablet Take 0.5 mg by mouth 2 (two) times daily as needed for anxiety (pt takes at bedtime each night).    [provider]  ?Calcium Carb-Cholecalciferol (CALCIUM+D3) 600-800 MG-UNIT TABS Take 1 tablet by mouth daily.    [provider]  ?Chlorphen-Pseudoephed-APAP (TYLENOL ALLERGY COMPLETE PO) Take 2 tablets by mouth at bedtime.    [provider]  ?citalopram (CELEXA) 20 MG tablet Take 40 mg by mouth every morning.    [provider]  ?fluticasone (FLONASE) 50 MCG/ACT nasal spray Place 2 sprays into both nostrils daily.    [provider]  ?HYDROcodone-acetaminophen (NORCO/VICODIN) 5-325 MG tablet Take 2 tablets by mouth every 4 (four) hours as needed. 02/07/20   Margarette Canada, NP  ?mometasone (ELOCON) 0.1 % cream Apply 1 application topically daily.    [provider]  ?montelukast (SINGULAIR) 10 MG tablet Take 10 mg by mouth at bedtime.    [provider]  ?NON FORMULARY Place 1 drop into the right eye 2 (two) times daily. Imprimis Eye Drop    [provider]  ?phenyltoloxamine-acetaminophen 30-325 MG tablet Take 2 tablets by mouth at bedtime.    [provider]  ?potassium chloride SA (KLOR-CON M) 20 MEQ tablet Take 1 tablet (20 mEq total) by mouth daily for 3 days. 04/11/21 04/14/21  Vanessa Benson, MD  ?predniSONE (STERAPRED UNI-PAK 21 TAB) 10 MG (21) TBPK tablet Take by mouth daily. Take 6 tabs by mouth daily  for 2 days, then 5 tabs for 2 days, then 4 tabs for 2 days, then 3 tabs for 2 days, 2  tabs for 2 days, then 1 tab by mouth daily for 2 days 02/07/20   Margarette Canada, NP  ?triamcinolone (KENALOG) 0.025 % cream Apply 1 application topically 2 (two) times daily.    [provider]  ?valsartan (DIOVAN) 160 MG tablet Take 160 mg by mouth daily. 01/03/20   [provider]  ?losartan-hydrochlorothiazide (HYZAAR) 100-25 MG tablet Take 1 tablet by mouth daily.  02/07/20  [provider]  ? ? ?Physical Exam: ?Vitals:  ? 05/04/21 1300 05/04/21 1345 05/04/21 1429 05/04/21 1637  ?BP: (!) 127/95 (!) 132/92 131/78 (!) 144/68  ?Pulse: (!) 104 (!) 104 (!) 53 77  ?Resp: (!) 22 (!) '22 19 16  '$ ?Temp:  98.8 ?F (37.1 ?C) 99.4 ?F (37.4 ?C) 98.4 ?F (36.9 ?C)  ?TempSrc:  Oral Oral Oral  ?SpO2: 99% 100% 94% 99%  ?Weight:      ? ?General: Not in acute distress ?HEENT: ?      Eyes: PERRL, EOMI, no scleral icterus. ?      ENT: No discharge from the ears and nose, no pharynx injection, no tonsillar enlargement.  ?      Neck: No JVD, no bruit, no mass felt. ?Heme: No neck lymph node enlargement. ?Cardiac: S1/S2, RRR, No murmurs, No gallops or rubs. ?Respiratory: No rales, wheezing, rhonchi or rubs. ?GI: Soft, nondistended, has mild lower abdominal tenderness, no rebound pain, no organomegaly, BS present. ?GU: No hematuria ?Ext: has trace leg edema bilaterally. 1+DP/PT pulse bilaterally. ?Musculoskeletal: No joint deformities, No joint redness or warmth, no limitation of ROM in spin. ?Skin: No rashes.  ?Neuro: Alert, oriented X3, cranial nerves II-XII grossly intact, moves all extremities normally.  ?Psych: Patient is not psychotic, no suicidal or hemocidal ideation. ? ?Labs on Admission: I have personally reviewed following labs and imaging studies ? ?CBC: ?Recent Labs  ?Lab 05/04/21 ?1103  ?WBC 16.7*  ?NEUTROABS 14.5*  ?HGB 13.0  ?HCT 39.0  ?MCV 90.3  ?PLT 226  ? ?Basic Metabolic Panel: ?Recent Labs  ?Lab 05/04/21 ?1103 05/04/21 ?1335  ?NA 134*  --   ?K 2.4*  --   ?CL 104  --   ?CO2 18*  --   ?GLUCOSE  210*  --   ?BUN 23  --   ?CREATININE 1.66*  --   ?CALCIUM 8.5*  --   ?MG  --  1.0*  ? ?GFR: ?Estimated Creatinine Clearance: 36.1 mL/min (A) (by C-G formula based on SCr of 1.66 mg/dL (H)). ?Liver Function Tests: ?

## 2021-05-04 NOTE — ED Notes (Signed)
See triage note, pt reports fever with nasal drainage since Sunday. Nausea and dry heaving that started yesterday. Reports centralized chest pain that started on the way here. +shob that started today.  ?Pt speaking in complete sentences, Nad noted.  ?

## 2021-05-04 NOTE — ED Notes (Signed)
Pt NAD in bed, a/ox4, speaking in full and complete sentences. Pt c/o "head full of green snot" x 3 days, with subjective fever and "swallowing mucous". Pt states she feels like it is all stuck in the back of the throat and she coughs it up as needed. Pt denies actual cough. LS clear bilat. Pt has vague SOB and CP symptoms that she cannot specify. Son at bedside, who visited this weekend,states also became sick with same.  ?

## 2021-05-04 NOTE — ED Provider Notes (Signed)
? ?[facility] ?Emergency Department Provider Note ? ?____________________________________________ ? ? Event Date/Time  ? First MD Initiated Contact with Patient 05/04/21 1105   ?  (approximate) ? ?I have reviewed the triage vital signs and the nursing notes. ? ? ?HISTORY ? ?Chief Complaint ?Chest Pain and Fever ? ? ?HPI ?Veronica Graham is a 76 y.o. female with a history of hypertension, diastolic CHF, CKD, asthma, DM II presents to the emergency department for treatment and evaluation of fever, nasal congestion, nausea, and intermittent chest discomfort. Nasal congestion and postnasal drip started last week and symptoms have progressed since then. Chest pain was present this morning, but none now since she has been able to "relax."  Fever up to 102 last night. No alleviating measures prior to arrival.  ? ?Past Medical History:  ?Diagnosis Date  ? Arthritis   ? Asthma   ? Breast cancer ([place]) 2003  ? right breast  ? Breast cancer ([place])   ? Depression   ? Environmental and seasonal allergies   ? HOH (hard of hearing)   ? Hypertension   ? Lower extremity edema   ? Obesity   ? Personal history of chemotherapy 2003  ? Rt. breast  ? Sinus headache   ? Sleep apnea   ? does not use CPAP  ? ? ?There are no problems to display for this patient. ? ? ?Past Surgical History:  ?Procedure Laterality Date  ? APPENDECTOMY    ? BREAST EXCISIONAL BIOPSY Right 2003  ? positve  ? BTL    ? CATARACT EXTRACTION W/PHACO Right 03/31/2017  ? Procedure: CATARACT EXTRACTION PHACO AND INTRAOCULAR LENS PLACEMENT (IOC);  Surgeon: Birder Robson, MD;  Location: [facility] ORS;  Service: Ophthalmology;  Laterality: Right;  [place] 00:38.2 ?AP% 18.2 ?CDE 6.95 ?Fluid Pcak lot # [id number] H  ? CATARACT EXTRACTION W/PHACO Left 04/22/2017  ? Procedure: CATARACT EXTRACTION PHACO AND INTRAOCULAR LENS PLACEMENT (IOC);  Surgeon: Birder Robson, MD;  Location: [facility] ORS;  Service: Ophthalmology;  Laterality: Left;  [place] 00:39.0 ?AP% 12.0 ?CDE  4.68 ?Fluid Pack Lot # [id number]  ? CHOLECYSTECTOMY    ? GASTRIC BYPASS    ? JOINT REPLACEMENT    ? MASTECTOMY, PARTIAL    ? REPLACEMENT TOTAL KNEE BILATERAL    ? ? ?Prior to Admission medications   ?Medication Sig Start Date End Date Taking? Authorizing Provider  ?albuterol (PROVENTIL HFA;VENTOLIN HFA) 108 (90 Base) MCG/ACT inhaler Inhale 2 puffs into the lungs every 4 (four) hours as needed for wheezing or shortness of breath.    [provider]  ?ALPRAZolam Duanne Moron) 0.5 MG tablet Take 0.5 mg by mouth 2 (two) times daily as needed for anxiety (pt takes at bedtime each night).    [provider]  ?Calcium Carb-Cholecalciferol (CALCIUM+D3) 600-800 MG-UNIT TABS Take 1 tablet by mouth daily.    [provider]  ?Chlorphen-Pseudoephed-APAP (TYLENOL ALLERGY COMPLETE PO) Take 2 tablets by mouth at bedtime.    [provider]  ?citalopram (CELEXA) 20 MG tablet Take 40 mg by mouth every morning.    [provider]  ?fluticasone (FLONASE) 50 MCG/ACT nasal spray Place 2 sprays into both nostrils daily.    [provider]  ?HYDROcodone-acetaminophen (NORCO/VICODIN) 5-325 MG tablet Take 2 tablets by mouth every 4 (four) hours as needed. 02/07/20   Margarette Canada, NP  ?mometasone (ELOCON) 0.1 % cream Apply 1 application topically daily.    [provider]  ?montelukast (SINGULAIR) 10 MG tablet Take 10 mg by mouth  at bedtime.    [provider]  ?NON FORMULARY Place 1 drop into the right eye 2 (two) times daily. Imprimis Eye Drop    [provider]  ?phenyltoloxamine-acetaminophen 30-325 MG tablet Take 2 tablets by mouth at bedtime.    [provider]  ?potassium chloride SA (KLOR-CON M) 20 MEQ tablet Take 1 tablet (20 mEq total) by mouth daily for 3 days. 04/11/21 04/14/21  Vanessa Bottineau, MD  ?predniSONE (STERAPRED UNI-PAK 21 TAB) 10 MG (21) TBPK tablet Take by mouth daily. Take 6 tabs by mouth daily  for 2 days, then 5 tabs for 2 days, then 4  tabs for 2 days, then 3 tabs for 2 days, 2 tabs for 2 days, then 1 tab by mouth daily for 2 days 02/07/20   Margarette Canada, NP  ?triamcinolone (KENALOG) 0.025 % cream Apply 1 application topically 2 (two) times daily.    [provider]  ?valsartan (DIOVAN) 160 MG tablet Take 160 mg by mouth daily. 01/03/20   [provider]  ?losartan-hydrochlorothiazide (HYZAAR) 100-25 MG tablet Take 1 tablet by mouth daily.  02/07/20  [provider]  ? ? ?Allergies ?Other ? ?History reviewed. No pertinent family history. ? ?Social History ?Social History  ? ?Tobacco Use  ? Smoking status: Never  ? Smokeless tobacco: Never  ?Substance Use Topics  ? Alcohol use: No  ? Drug use: No  ? ?____________________________________________ ? ? ?PHYSICAL EXAM: ? ?VITAL SIGNS: ?ED Triage Vitals  ?Enc Vitals Group  ?   BP 05/04/21 1047 (!) 168/77  ?   Pulse Rate 05/04/21 1047 (!) 110  ?   Resp 05/04/21 1047 (!) 22  ?   Temp 05/04/21 1047 99.1 ?F (37.3 ?C)  ?   Temp Source 05/04/21 1047 Oral  ?   SpO2 05/04/21 1047 98 %  ?   Weight 05/04/21 1045 250 lb (113.4 kg)  ?   Height --   ?   Head Circumference --   ?   Peak Flow --   ?   Pain Score 05/04/21 1045 4  ?   Pain Loc --   ?   Pain Edu? --   ?   Excl. in [place]? --   ? ? ?Constitutional: Alert and oriented. Overall well appearing and in no acute distress. Normal mental status. ?Eyes: Conjunctivae are normal. PERRL. ?Head: Atraumatic. ?Nose: No congestion/rhinnorhea. ?Mouth/Throat: Mucous membranes are moist.  Oropharynx non-erythematous. Tongue normal in size and color. ?Neck: No stridor. ?Hematological/Lymphatic/Immunilogical: No cervical lymphadenopathy. ?Cardiovascular: Normal rate, regular rhythm. Grossly normal heart sounds.  Good peripheral circulation. No pitting edema in lower extremities. ?Respiratory: Normal respiratory effort.  No retractions. Lungs CTAB. ?Gastrointestinal: Soft and nontender. No distention. No abdominal bruits.  ?Genitourinary: Exam  deferred. ?Musculoskeletal: No lower extremity tenderness.  ?Neurologic:  Normal speech and language. No gross focal neurologic deficits are appreciated. ?Skin:  Skin is warm, dry and intact. No rash noted. ?Psychiatric: Mood and affect are normal. Speech and behavior are normal. ? ?____________________________________________ ?  ?LABS ?(all labs ordered are listed, but only abnormal results are displayed) ? ?Labs Reviewed  ?CBC WITH DIFFERENTIAL/PLATELET - Abnormal; Notable for the following components:  ?    Result Value  ? WBC 16.7 (*)   ? Neutro Abs 14.5 (*)   ? Abs Immature Granulocytes 0.25 (*)   ? All other components within normal limits  ?COMPREHENSIVE METABOLIC PANEL - Abnormal; Notable for the following components:  ? Sodium 134 (*)   ?  Potassium 2.4 (*)   ? CO2 18 (*)   ? Glucose, Bld 210 (*)   ? Creatinine, Ser 1.66 (*)   ? Calcium 8.5 (*)   ? Albumin 2.9 (*)   ? Alkaline Phosphatase 158 (*)   ? GFR, Estimated 32 (*)   ? All other components within normal limits  ?LACTIC ACID, PLASMA - Abnormal; Notable for the following components:  ? Lactic Acid, Venous 2.5 (*)   ? All other components within normal limits  ?URINALYSIS, COMPLETE (UACMP) WITH MICROSCOPIC - Abnormal; Notable for the following components:  ? Color, Urine YELLOW (*)   ? APPearance CLOUDY (*)   ? Glucose, UA >=500 (*)   ? Hgb urine dipstick MODERATE (*)   ? Protein, ur 100 (*)   ? Leukocytes,Ua MODERATE (*)   ? Bacteria, UA MANY (*)   ? All other components within normal limits  ?TROPONIN I (HIGH SENSITIVITY) - Abnormal; Notable for the following components:  ? Troponin I (High Sensitivity) 44 (*)   ? All other components within normal limits  ?RESP PANEL BY RT-PCR (FLU A&B, COVID) [id number]  ?CULTURE, BLOOD (ROUTINE X 2)  ?CULTURE, BLOOD (ROUTINE X 2)  ?URINE CULTURE  ?PROTIME-INR  ?APTT  ?LACTIC ACID, PLASMA  ?MAGNESIUM  ?TROPONIN I (HIGH SENSITIVITY)  ? ?____________________________________________ ? ?EKG ? ?ED ECG REPORT ?I, Sherrie George, FNP-BC personally viewed and interpreted this ECG. ? ? Date: 05/04/2021 ? EKG Time: 1048 ? Rate: 104 ? Rhythm: sinus tachycardia ? Axis: normal ? Intervals:nonspecific intraventricular conduction delay ? ST&T Change: no

## 2021-05-05 DIAGNOSIS — R21 Rash and other nonspecific skin eruption: Secondary | ICD-10-CM | POA: Diagnosis present

## 2021-05-05 DIAGNOSIS — E66813 Obesity, class 3: Secondary | ICD-10-CM | POA: Diagnosis present

## 2021-05-05 DIAGNOSIS — N1832 Chronic kidney disease, stage 3b: Secondary | ICD-10-CM

## 2021-05-05 DIAGNOSIS — I5033 Acute on chronic diastolic (congestive) heart failure: Secondary | ICD-10-CM

## 2021-05-05 LAB — CBC
HCT: 38.9 % (ref 36.0–46.0)
Hemoglobin: 12.9 g/dL (ref 12.0–15.0)
MCH: 29.7 pg (ref 26.0–34.0)
MCHC: 33.2 g/dL (ref 30.0–36.0)
MCV: 89.4 fL (ref 80.0–100.0)
Platelets: 213 10*3/uL (ref 150–400)
RBC: 4.35 MIL/uL (ref 3.87–5.11)
RDW: 14 % (ref 11.5–15.5)
WBC: 18.6 10*3/uL — ABNORMAL HIGH (ref 4.0–10.5)
nRBC: 0 % (ref 0.0–0.2)

## 2021-05-05 LAB — GASTROINTESTINAL PANEL BY PCR, STOOL (REPLACES STOOL CULTURE)

## 2021-05-05 LAB — BASIC METABOLIC PANEL
Anion gap: 9 (ref 5–15)
BUN: 24 mg/dL — ABNORMAL HIGH (ref 8–23)
CO2: 20 mmol/L — ABNORMAL LOW (ref 22–32)
Calcium: 8.5 mg/dL — ABNORMAL LOW (ref 8.9–10.3)
Chloride: 105 mmol/L (ref 98–111)
Creatinine, Ser: 1.63 mg/dL — ABNORMAL HIGH (ref 0.44–1.00)
GFR, Estimated: 33 mL/min — ABNORMAL LOW (ref >60)
Glucose, Bld: 128 mg/dL — ABNORMAL HIGH (ref 70–99)
Potassium: 3.1 mmol/L — ABNORMAL LOW (ref 3.5–5.1)
Sodium: 134 mmol/L — ABNORMAL LOW (ref 135–145)

## 2021-05-05 LAB — LIPID PANEL
Cholesterol: 109 mg/dL (ref 0–200)
HDL: 46 mg/dL (ref >40)
LDL Cholesterol: 48 mg/dL (ref 0–99)
Total CHOL/HDL Ratio: 2.4 RATIO
Triglycerides: 74 mg/dL (ref <150)
VLDL: 15 mg/dL (ref 0–40)

## 2021-05-05 LAB — GLUCOSE, CAPILLARY: Glucose-Capillary: 138 mg/dL — ABNORMAL HIGH (ref 70–99)

## 2021-05-05 LAB — C DIFFICILE QUICK SCREEN W PCR REFLEX
C Diff antigen: NEGATIVE
C Diff interpretation: NOT DETECTED
C Diff toxin: NEGATIVE

## 2021-05-05 LAB — PROCALCITONIN: Procalcitonin: 1.51 ng/mL

## 2021-05-05 LAB — MAGNESIUM: Magnesium: 2.1 mg/dL (ref 1.7–2.4)

## 2021-05-05 MED ORDER — FUROSEMIDE 10 MG/ML IJ SOLN
20.0000 mg | Freq: Two times a day (BID) | INTRAMUSCULAR | Status: DC
  Administered 2021-05-05 – 2021-05-06 (×2): 20 mg via INTRAVENOUS
  Filled 2021-05-05 (×2): qty 2

## 2021-05-05 MED ORDER — ENOXAPARIN SODIUM 60 MG/0.6ML IJ SOSY
55.0000 mg | PREFILLED_SYRINGE | Freq: Every day | INTRAMUSCULAR | Status: DC
  Administered 2021-05-05 – 2021-05-07 (×3): 55 mg via SUBCUTANEOUS
  Filled 2021-05-05 (×3): qty 0.6

## 2021-05-05 MED ORDER — POTASSIUM CHLORIDE CRYS ER 20 MEQ PO TBCR
40.0000 meq | EXTENDED_RELEASE_TABLET | Freq: Once | ORAL | Status: DC
Start: 2021-05-05 — End: 2021-05-06
  Filled 2021-05-05: qty 2

## 2021-05-05 NOTE — Assessment & Plan Note (Signed)
Replacing as needed.  Secondary to dehydration plus diarrhea. ?

## 2021-05-05 NOTE — Assessment & Plan Note (Signed)
-   Continue home medications 

## 2021-05-05 NOTE — Hospital Course (Addendum)
Patient is a 76 year old female with past medical history of morbid obesity, obstructive sleep apnea not on CPAP, stage IIIa chronic kidney disease, breast cancer status postchemotherapy, diastolic CHF and hypertension who presented to the emergency room on 3/25 with complaints of fever, urinary frequency and diarrhea times several days.  In the emergency room, patient found to be in severe sepsis felt to be secondary to a urinary tract infection.  Patient treated with IV fluids and antibiotics and admitted to the hospitalist service. ? ?Over the next few days, she responded well and by day of discharge, 3/28, white blood cell count had fully resolved.  Patient evaluated by physical therapy and found to have no needs.  ?

## 2021-05-05 NOTE — Assessment & Plan Note (Addendum)
Cultures for pansensitive E. coli.  Changed to IV Ancef.  Changed to p.o. Omnicef on discharge. ?

## 2021-05-05 NOTE — Assessment & Plan Note (Addendum)
Possible viral gastroenteritis.  GI panel unremarkable.  Resolved.  Patient able tolerate p.o. ?

## 2021-05-05 NOTE — Progress Notes (Signed)
[facility] Progress Note ? ?Patient: Veronica Graham    ZOX:[id number]  DOA: 05/04/2021    ?Date of Service: the patient was seen and examined on 05/05/2021 ? ?Brief hospital course: ?Patient is a 76 year old female with past medical history of morbid obesity, obstructive sleep apnea not on CPAP, stage IIIa chronic kidney disease, breast cancer status postchemotherapy, diastolic CHF and hypertension who presented to the emergency room on 3/25 with complaints of fever, urinary frequency and diarrhea times several days.  In the emergency room, patient found to be in severe sepsis felt to be secondary to a urinary tract infection.  Patient treated with IV fluids and antibiotics and admitted to the hospitalist service. ? ?Assessment and Plan: ?Assessment and Plan: ?* UTI (urinary tract infection) ?Urine cultures pending.  As above.  White blood cell count slightly increased. ? ?Severe sepsis ([place]) ?Patient meets criteria for severe sepsis on admission given tachypnea, tachycardia and leukocytosis with urinary source.  Sepsis appears to have stabilized as lactic acidosis has since resolved.  Would continue fluids and antibiotics.  Blood pressures stable.  Although white blood cell count slightly increased from previous day, procalcitonin level trending down, so would not change antibiotics. ? ?Acute on chronic diastolic CHF (congestive heart failure) ([place]) ?Elevated BNP at 500 even prior to patient getting her IV fluids.  For now, holding off on diuresis until sepsis confirmed to be stabilized. ? ?HTN (hypertension) ?Pressure stable.  Continue home medications ? ?Chronic kidney disease, stage 3b ([place]) ?No previous labs to compare to other than creatinine earlier this month at 1.83 with GFR of 29.  In comparison to that, renal function actually slightly improved and currently with GFR of 33 with creatinine around 1.6.  Continue IV fluids. ? ?Rash and nonspecific skin eruption ?Patient states that rash started a few  days ago.  It is nontender.  She is not on any anticoagulation or aspirin.  She did not fall and injure herself.  She says it does not itch and she does not feel like it is gotten worse.  Will monitor closely. ? ? ? ?Diarrhea ?Possible viral gastroenteritis.  GI panel pending.  Continue IV fluids. ? ?Hypokalemia ?Replacing as needed.  Secondary to dehydration plus diarrhea. ? ?Depression with anxiety ?Continue home medications ? ?Morbid obesity ([place]) ?Patient meets criteria BMI greater than 40. ? ? ? ? ? ? ?Body mass index is 42.91 kg/m?.  ?  ?   ? ?Consultants: ?None ? ?Procedures: ?None ? ?Antimicrobials: ?IV Rocephin 3/25-present ? ?Code Status: Full code ? ? ?Subjective: Patient complains of dyspnea on exertion. ? ?Objective: ?Vital signs were reviewed and unremarkable. ?Vitals:  ? 05/05/21 0317 05/05/21 0814  ?BP: 135/77 (!) 145/80  ?Pulse: 92 97  ?Resp: 20 17  ?Temp: 98.5 ?F (36.9 ?C) 99.3 ?F (37.4 ?C)  ?SpO2: 100% 96%  ? ? ?Intake/Output Summary (Last 24 hours) at 05/05/2021 1323 ?Last data filed at 05/05/2021 0500 ?Gross per 24 hour  ?Intake 1919.13 ml  ?Output --  ?Net 1919.13 ml  ? ?Filed Weights  ? 05/04/21 1045  ?Weight: 113.4 kg  ? ?Body mass index is 42.91 kg/m?. ? ?Exam: ? ?General: Alert and oriented x3, no acute distress ?HEENT: Normocephalic and atraumatic, mucous membranes are moist ?Cardiovascular: Regular rate and rhythm, S1-S2 ?Respiratory: Clear to auscultation bilaterally ?Abdomen: Soft, see description in skin below, nontender, positive bowel sounds ?Musculoskeletal: No clubbing or cyanosis, trace pitting edema ?Skin: Patient has a macular erythematous nonconfluent nonpruritic nonraised rash across most of  her lower abdomen.  Nonblanching. ?Psychiatry: Appropriate, no evidence of psychoses ?Neurology: No focal deficits ? ?Data Reviewed: ?Noted increase in white count and decrease in procalcitonin.  Noted BNP of 546. ? ?Disposition:  ?Status is: Inpatient ?Remains inpatient appropriate because:  Stabilization of sepsis ?  ? ?Anticipated discharge date: 3/29 ? ?Remaining issues to be resolved so that patient can be discharged: Stabilization of sepsis, any diuresis of CHF, rash identification ? ? ?Family Communication: Son at the bedside ?DVT Prophylaxis: ? ?  Lovenox ? ? ?Author: ?Annita Brod ,MD ?05/05/2021 1:23 PM ? ?To reach On-call, see care teams to locate the attending and reach out via [email or web address]. ?Between 7PM-7AM, please contact night-coverage ?If you still have difficulty reaching the attending provider, please page the [facility] (Director on Call) for [facility] on amion for assistance. ?[employer] in [place] I can honestly say that the 1 in [place] so much better this was the first with happens to what do not ever want like I keep lots of flavored sparkling waters as well as a few energy drinks in his fridge so he does not want sounds help resolve ?

## 2021-05-05 NOTE — Assessment & Plan Note (Addendum)
Patient states that rash started a few days ago.  It is nontender.  She is not on any anticoagulation or aspirin.  She did not fall and injure herself.  She says it does not itch and she does not feel like it is gotten worse.  Suspect if this was antifungal.  Patient started on Diflucan on 3/27.  By 3/28, had marked improvement in most of erythema resolved.. ? ? ?

## 2021-05-05 NOTE — Assessment & Plan Note (Addendum)
Blood cellPatient meets criteria for severe sepsis on admission given tachypnea, tachycardia and leukocytosis with urinary source.  Sepsis appears to have stabilized as lactic acidosis has since resolved.  Blood pressure stable.  Count and procalcitonin level trending downward.  E. coli noted to be pansensitive so switched to Ancef.  By day of discharge, had completed 3 days of IV antibiotics and will be discharged on 2 more days of p.o. ?

## 2021-05-05 NOTE — Assessment & Plan Note (Addendum)
Elevated BNP at 500 even prior to patient getting her IV fluids.  Have stopped IV fluids and had some diuresis. ?

## 2021-05-05 NOTE — Assessment & Plan Note (Signed)
Pressure stable.  Continue home medications ?

## 2021-05-05 NOTE — Assessment & Plan Note (Addendum)
No previous labs to compare to other than creatinine earlier this month at 1.83 with GFR of 29.  By day of discharge following Lasix, creatinine at 1.84. ?

## 2021-05-05 NOTE — Assessment & Plan Note (Signed)
Patient meets criteria BMI greater than 40. ?

## 2021-05-06 LAB — GLUCOSE, CAPILLARY
Glucose-Capillary: 83 mg/dL (ref 70–99)
Glucose-Capillary: 108 mg/dL — ABNORMAL HIGH (ref 70–99)
Glucose-Capillary: 112 mg/dL — ABNORMAL HIGH (ref 70–99)

## 2021-05-06 LAB — PROCALCITONIN: Procalcitonin: 0.95 ng/mL

## 2021-05-06 LAB — URINE CULTURE: Culture: >=100000 — AB

## 2021-05-06 LAB — BASIC METABOLIC PANEL
Anion gap: 9 (ref 5–15)
BUN: 29 mg/dL — ABNORMAL HIGH (ref 8–23)
CO2: 21 mmol/L — ABNORMAL LOW (ref 22–32)
Calcium: 8.5 mg/dL — ABNORMAL LOW (ref 8.9–10.3)
Chloride: 106 mmol/L (ref 98–111)
Creatinine, Ser: 1.7 mg/dL — ABNORMAL HIGH (ref 0.44–1.00)
GFR, Estimated: 31 mL/min — ABNORMAL LOW (ref >60)
Glucose, Bld: 99 mg/dL (ref 70–99)
Potassium: 2.9 mmol/L — ABNORMAL LOW (ref 3.5–5.1)
Sodium: 136 mmol/L (ref 135–145)

## 2021-05-06 LAB — CBC
HCT: 36 % (ref 36.0–46.0)
Hemoglobin: 12 g/dL (ref 12.0–15.0)
MCH: 29.9 pg (ref 26.0–34.0)
MCHC: 33.3 g/dL (ref 30.0–36.0)
MCV: 89.6 fL (ref 80.0–100.0)
Platelets: 202 10*3/uL (ref 150–400)
RBC: 4.02 MIL/uL (ref 3.87–5.11)
RDW: 13.9 % (ref 11.5–15.5)
WBC: 16.6 10*3/uL — ABNORMAL HIGH (ref 4.0–10.5)
nRBC: 0 % (ref 0.0–0.2)

## 2021-05-06 LAB — BRAIN NATRIURETIC PEPTIDE: B Natriuretic Peptide: 317.1 pg/mL — ABNORMAL HIGH (ref 0.0–100.0)

## 2021-05-06 MED ORDER — POTASSIUM CHLORIDE CRYS ER 20 MEQ PO TBCR
40.0000 meq | EXTENDED_RELEASE_TABLET | Freq: Every day | ORAL | Status: DC
  Administered 2021-05-06 – 2021-05-07 (×2): 40 meq via ORAL
  Filled 2021-05-06 (×2): qty 2

## 2021-05-06 MED ORDER — FLUCONAZOLE 100 MG PO TABS
100.0000 mg | ORAL_TABLET | Freq: Every day | ORAL | Status: DC
  Administered 2021-05-06 – 2021-05-07 (×2): 100 mg via ORAL
  Filled 2021-05-06 (×2): qty 1

## 2021-05-06 MED ORDER — CEFAZOLIN SODIUM-DEXTROSE 1-4 GM/50ML-% IV SOLN
1.0000 g | Freq: Two times a day (BID) | INTRAVENOUS | Status: DC
  Administered 2021-05-06 – 2021-05-07 (×2): 1 g via INTRAVENOUS
  Filled 2021-05-06 (×3): qty 50

## 2021-05-06 NOTE — Progress Notes (Signed)
[facility] Progress Note ? ?Patient: Veronica Graham    VQQ:[id number]  DOA: 05/04/2021    ?Date of Service: the patient was seen and examined on 05/06/2021 ? ?Brief hospital course: ?Patient is a 76 year old female with past medical history of morbid obesity, obstructive sleep apnea not on CPAP, stage IIIa chronic kidney disease, breast cancer status postchemotherapy, diastolic CHF and hypertension who presented to the emergency room on 3/25 with complaints of fever, urinary frequency and diarrhea times several days.  In the emergency room, patient found to be in severe sepsis felt to be secondary to a urinary tract infection.  Patient treated with IV fluids and antibiotics and admitted to the hospitalist service. ? ?Assessment and Plan: ?Assessment and Plan: ?* UTI (urinary tract infection) ?Cultures for pansensitive E. coli.  Changed to IV Ancef.  Changed to p.o. once white count normalized ? ?Severe sepsis ([place]) ?Blood cellPatient meets criteria for severe sepsis on admission given tachypnea, tachycardia and leukocytosis with urinary source.  Sepsis appears to have stabilized as lactic acidosis has since resolved.  Blood pressure stable.  Count and procalcitonin level trending downward.  E. coli noted to be pansensitive so switched to Ancef. ? ?Acute on chronic diastolic CHF (congestive heart failure) ([place]) ?Elevated BNP at 500 even prior to patient getting her IV fluids.  Have stopped IV fluids and started IV Lasix. ? ?HTN (hypertension) ?Pressure stable.  Continue home medications ? ?Chronic kidney disease, stage 3b ([place]) ?No previous labs to compare to other than creatinine earlier this month at 1.83 with GFR of 29.  In comparison to that, renal function actually slightly improved and currently with GFR of 33 with creatinine around 1.6.  Continue IV fluids. ? ?Rash and nonspecific skin eruption ?Patient states that rash started a few days ago.  It is nontender.  She is not on any anticoagulation or  aspirin.  She did not fall and injure herself.  She says it does not itch and she does not feel like it is gotten worse.  Will monitor closely.  Question if this is antifungal.  We will start some Diflucan.. ? ? ? ?Diarrhea ?Possible viral gastroenteritis.  GI panel pending.  Continue IV fluids. ? ?Hypokalemia ?Replacing as needed.  Secondary to dehydration plus diarrhea. ? ?Depression with anxiety ?Continue home medications ? ?Morbid obesity ([place]) ?Patient meets criteria BMI greater than 40. ? ? ? ? ? ? ?Body mass index is 42.91 kg/m?.  ?  ?   ? ?Consultants: ?None ? ?Procedures: ?None ? ?Antimicrobials: ?IV Rocephin 3/25-present ? ?Code Status: Full code ? ? ?Subjective: Patient complains of a mild cough.  She is overall she is feeling a little bit better. ? ?Objective: ?Vital signs were reviewed and unremarkable. ?Vitals:  ? 05/06/21 1159 05/06/21 1616  ?BP: (!) 137/97 113/63  ?Pulse: 79 92  ?Resp: 16 16  ?Temp: 98 ?F (36.7 ?C) 98.1 ?F (36.7 ?C)  ?SpO2: 99% 96%  ? ? ?Intake/Output Summary (Last 24 hours) at 05/06/2021 1708 ?Last data filed at 05/06/2021 0300 ?Gross per 24 hour  ?Intake 100 ml  ?Output --  ?Net 100 ml  ? ? ?Filed Weights  ? 05/04/21 1045  ?Weight: 113.4 kg  ? ?Body mass index is 42.91 kg/m?. ? ?Exam: ? ?General: Alert and oriented x3, no acute distress ?HEENT: Normocephalic and atraumatic, mucous membranes are moist ?Cardiovascular: Regular rate and rhythm, S1-S2 ?Respiratory: Clear to auscultation bilaterally ?Abdomen: Soft, see description in skin below, nontender, positive bowel sounds ?Musculoskeletal: No clubbing  or cyanosis, trace pitting edema ?Skin: Patient has a macular erythematous nonconfluent nonpruritic nonraised rash across most of her lower abdomen.  Nonblanching. ?Psychiatry: Appropriate, no evidence of psychoses ?Neurology: No focal deficits ? ?Data Reviewed: ?Minimal increase in renal function following diuresis.  White count starting to trend downward as is  procalcitonin. ? ?Disposition:  ?Status is: Inpatient ?Remains inpatient appropriate because: Continued need for IV antibiotics ?  ? ?Anticipated discharge date: 3/29 ? ?Remaining issues to be resolved so that patient can be discharged: Waiting for white count to normalize.  Also need to fully diurese. ? ? ?Family Communication: Son at the bedside ?DVT Prophylaxis: ? ?  Lovenox ? ? ?Author: ?Annita Brod ,MD ?05/06/2021 5:08 PM ? ?To reach On-call, see care teams to locate the attending and reach out via [email or web address]. ?Between 7PM-7AM, please contact night-coverage ?If you still have difficulty reaching the attending provider, please page the [facility] (Director on Call) for [facility] on amion for assistance. ?[employer] in [place] I can honestly say that the 1 in [place] so much better this was the first with happens to what do not ever want like I keep lots of flavored sparkling waters as well as a few energy drinks in his fridge so he does not want sounds help resolve ?

## 2021-05-07 DIAGNOSIS — R21 Rash and other nonspecific skin eruption: Secondary | ICD-10-CM

## 2021-05-07 LAB — CBC
HCT: 36.1 % (ref 36.0–46.0)
Hemoglobin: 11.9 g/dL — ABNORMAL LOW (ref 12.0–15.0)
MCH: 29.4 pg (ref 26.0–34.0)
MCHC: 33 g/dL (ref 30.0–36.0)
MCV: 89.1 fL (ref 80.0–100.0)
Platelets: 229 10*3/uL (ref 150–400)
RBC: 4.05 MIL/uL (ref 3.87–5.11)
RDW: 14 % (ref 11.5–15.5)
WBC: 9.1 10*3/uL (ref 4.0–10.5)
nRBC: 0 % (ref 0.0–0.2)

## 2021-05-07 LAB — BASIC METABOLIC PANEL
Anion gap: 11 (ref 5–15)
BUN: 31 mg/dL — ABNORMAL HIGH (ref 8–23)
CO2: 22 mmol/L (ref 22–32)
Calcium: 8.6 mg/dL — ABNORMAL LOW (ref 8.9–10.3)
Chloride: 105 mmol/L (ref 98–111)
Creatinine, Ser: 1.84 mg/dL — ABNORMAL HIGH (ref 0.44–1.00)
GFR, Estimated: 28 mL/min — ABNORMAL LOW (ref >60)
Glucose, Bld: 93 mg/dL (ref 70–99)
Potassium: 3 mmol/L — ABNORMAL LOW (ref 3.5–5.1)
Sodium: 138 mmol/L (ref 135–145)

## 2021-05-07 LAB — GLUCOSE, CAPILLARY: Glucose-Capillary: 75 mg/dL (ref 70–99)

## 2021-05-07 MED ORDER — FLUCONAZOLE 100 MG PO TABS: 100.0000 mg | ORAL_TABLET | Freq: Every day | ORAL | 0 refills | Status: AC

## 2021-05-07 MED ORDER — CEFDINIR 300 MG PO CAPS: 300.0000 mg | ORAL_CAPSULE | Freq: Two times a day (BID) | ORAL | 0 refills | Status: DC

## 2021-05-07 NOTE — Care Management (Signed)
?  Transition of Care (TOC) Screening Note ? ? ?Patient Details  ?Name: Veronica Graham ?Date of Birth: 08-10-1945 ? ? ?Transition of Care (TOC) CM/SW Contact:    ?Pete Pelt, RN ?Phone Number: ?05/07/2021, 4:08 PM ? ? ? ?Transition of Care Department [facility]) has reviewed patient and no TOC needs have been identified at this time. We will continue to monitor patient advancement through interdisciplinary progression rounds. If new patient transition needs arise, please place a TOC consult. ?  ?

## 2021-05-07 NOTE — Evaluation (Signed)
Physical Therapy Evaluation ?Patient Details ?Name: Veronica Graham ?MRN: [id number] ?DOB: 11/27/1945 ?Today's Date: 05/07/2021 ? ?History of Present Illness ? Patient is a 76 year old female with past medical history of morbid obesity, obstructive sleep apnea not on CPAP, stage IIIa chronic kidney disease, breast cancer status postchemotherapy, diastolic CHF and hypertension who presented to the emergency room on 3/25 with complaints of fever, urinary frequency and diarrhea times several days.  In the emergency room, patient found to be in severe sepsis felt to be secondary to a urinary tract infection. ?  ?Clinical Impression ? Pt admitted with above diagnosis. Pt received supine in bed, agreeable to PT. Pt reports at baseline she is independent with ADL's/IADL's and ambulation but has minor balance deficits. Pt's husband is in a w/c and pt relies on sons at home to assist in transfers and other ADL's for husband but pt sometimes assists in don/doff clothing. Pt is mod-I with bed mobility to sit EoB with HOB elevated and standing independently and ambulating 50' with supervision. Pt ambulates with antalgic gait on RLE with intermittent touching of hall way railing for support. Pt declining further distances to ensure no SOB. Pt returning to bed. Education provided on energy conservation techniques, safe use of DME if mobility/balance worsens beyond baseline, and process of receiving referral to OPPT if needed in the future for said deficits. Pt verbalizing understanding. All needs in reach. Education complete with pt being close to baseline mobility. PT to sign off.    ?  ?   ? ?Recommendations for follow up therapy are one component of a multi-disciplinary discharge planning process, led by the attending physician.  Recommendations may be updated based on patient status, additional functional criteria and insurance authorization. ? ?Follow Up Recommendations No PT follow up ? ?  ?Assistance Recommended at Discharge  None  ?Patient can return home with the following ? Help with stairs or ramp for entrance ? ?  ?Equipment Recommendations None recommended by PT  ?Recommendations for Other Services ?    ?  ?Functional Status Assessment Patient has had a recent decline in their functional status and demonstrates the ability to make significant improvements in function in a reasonable and predictable amount of time.  ? ?  ?Precautions / Restrictions Precautions ?Precautions: Fall ?Restrictions ?Weight Bearing Restrictions: No  ? ?  ? ?Mobility ? Bed Mobility ?Overal bed mobility: Modified Independent ?  ?  ?  ?  ?  ?  ?General bed mobility comments: HOB elevated, increased time ?Patient Response: Cooperative ? ?Transfers ?Overall transfer level: Needs assistance ?Equipment used: None ?Transfers: Sit to/from Stand ?Sit to Stand: Supervision ?  ?  ?  ?  ?  ?  ?  ? ?Ambulation/Gait ?Ambulation/Gait assistance: Supervision ?Gait Distance (Feet): 50 Feet ?Assistive device: None ?Gait Pattern/deviations: Step-to pattern, Antalgic ?Gait velocity: Pt reports decreased velocity from baseline ?  ?  ?General Gait Details: Intermittent use of handrail in hallway for steadying. ? ?Stairs ?  ?  ?  ?  ?  ? ?Wheelchair Mobility ?  ? ?Modified Rankin (Stroke Patients Only) ?  ? ?  ? ?Balance Overall balance assessment: Needs assistance ?Sitting-balance support: No upper extremity supported, Feet supported ?Sitting balance-Leahy Scale: Fair ?  ?  ?  ?Standing balance-Leahy Scale: Fair ?  ?  ?  ?  ?  ?  ?  ?  ?  ?  ?  ?  ?   ? ? ? ?Pertinent Vitals/Pain Pain Assessment ?Pain Assessment: No/denies  pain  ? ? ?Home Living Family/patient expects to be discharged to:: Private residence ?Living Arrangements: Spouse/significant other;Children ?Available Help at Discharge: Family;Available 24 hours/day ?Type of Home: House ?Home Access: Level entry ?  ?  ?  ?Home Layout: One level ?Home Equipment: Rolling Walker (2 wheels);Grab bars - tub/shower;Tub  bench ?Additional Comments: Has hoyer lift for husband  ?  ?Prior Function Prior Level of Function : Independent/Modified Independent ?  ?  ?  ?  ?  ?  ?  ?ADLs Comments: Has sons that assist in pt's husbands care, some grocery runs, household tasks ?  ? ? ?Hand Dominance  ?   ? ?  ?Extremity/Trunk Assessment  ? Upper Extremity Assessment ?Upper Extremity Assessment: Overall WFL for tasks assessed ?  ? ?Lower Extremity Assessment ?Lower Extremity Assessment: Generalized weakness ?  ? ?Cervical / Trunk Assessment ?Cervical / Trunk Assessment: Normal  ?Communication  ? Communication: No difficulties;HOH  ?Cognition Arousal/Alertness: Awake/alert ?Behavior During Therapy: [facility] for tasks assessed/performed ?Overall Cognitive Status: Within Functional Limits for tasks assessed ?  ?  ?  ?  ?  ?  ?  ?  ?  ?  ?  ?  ?  ?  ?  ?  ?  ?  ?  ? ?  ?General Comments   ? ?  ?Exercises Other Exercises ?Other Exercises: Role of PT in acute setting, energy conservation techniques  ? ?Assessment/Plan  ?  ?PT Assessment Patient does not need any further PT services  ?PT Problem List   ? ?   ?  ?PT Treatment Interventions     ? ?PT Goals (Current goals can be found in the Care Plan section)  ?Acute Rehab PT Goals ?Patient Stated Goal: to return home ?PT Goal Formulation: With patient ?Time For Goal Achievement: 05/21/21 ?Potential to Achieve Goals: Good ? ?  ?Frequency   ?  ? ? ?Co-evaluation   ?  ?  ?  ?  ? ? ?  ?AM-PAC PT "6 Clicks" Mobility  ?Outcome Measure Help needed turning from your back to your side while in a flat bed without using bedrails?: None ?Help needed moving from lying on your back to sitting on the side of a flat bed without using bedrails?: A Little ?Help needed moving to and from a bed to a chair (including a wheelchair)?: None ?Help needed standing up from a chair using your arms (e.g., wheelchair or bedside chair)?: A Little ?Help needed to walk in hospital room?: None ?Help needed climbing 3-5 steps with a railing?  : A Lot ?6 Click Score: 20 ? ?  ?End of Session   ?Activity Tolerance: Patient tolerated treatment well ?Patient left: in bed;with call bell/phone within reach ?Nurse Communication: Mobility status ?PT Visit Diagnosis: Other abnormalities of gait and mobility (R26.89);Unsteadiness on feet (R26.81) ?  ? ?Time: 1761-6073 ?PT Time Calculation (min) (ACUTE ONLY): 17 min ? ? ?Charges:   PT Evaluation ?$PT Eval Low Complexity: 1 Low ?PT Treatments ?$Self Care/Home Management: 8-22 ?  ?   ? ?Salem Caster. Fairly IV, PT, DPT ?Physical Therapist- [place]  ?[facility]  ?05/07/2021, 2:27 PM ? ?

## 2021-05-07 NOTE — Discharge Summary (Signed)
?Physician Discharge Summary ?  ?Patient: Veronica Graham MRN: [id number] DOB: May 12, 1945  ?Admit date:     05/04/2021  ?Discharge date: 05/07/21  ?Discharge Physician: Annita Brod  ? ?PCP: Derinda Late, MD  ? ?Recommendations at discharge:  ? ?Diflucan 100 mg p.o. daily x2 more days ?Omnicef 300 mg p.o. twice daily x3 more days ?Patient will follow-up with her PCP in the next few weeks ? ?Discharge Diagnoses: ?Principal Problem: ?  UTI (urinary tract infection) ?Active Problems: ?  Severe sepsis ([place]) ?  Acute on chronic diastolic CHF (congestive heart failure) ([place]) ?  HTN (hypertension) ?  Chronic kidney disease, stage 3b ([place]) ?  Diarrhea ?  Rash and nonspecific skin eruption ?  Depression with anxiety ?  Hypokalemia ?  Morbid obesity ([place]) ?  Asthma ?  Elevated troponin ?  Chest pain ?  Hyperglycemia ?  Hypomagnesemia ? ?Resolved Problems: ?  * No resolved hospital problems. * ? ?Hospital Course: ?Patient is a 76 year old female with past medical history of morbid obesity, obstructive sleep apnea not on CPAP, stage IIIa chronic kidney disease, breast cancer status postchemotherapy, diastolic CHF and hypertension who presented to the emergency room on 3/25 with complaints of fever, urinary frequency and diarrhea times several days.  In the emergency room, patient found to be in severe sepsis felt to be secondary to a urinary tract infection.  Patient treated with IV fluids and antibiotics and admitted to the hospitalist service. ? ?Over the next few days, she responded well and by day of discharge, 3/28, white blood cell count had fully resolved.  Patient evaluated by physical therapy and found to have no needs.  ? ?Assessment and Plan: ?* UTI (urinary tract infection) ?Cultures for pansensitive E. coli.  Changed to IV Ancef.  Changed to p.o. Omnicef on discharge. ? ?Severe sepsis ([place]) ?Blood cellPatient meets criteria for severe sepsis on admission given tachypnea, tachycardia and leukocytosis with urinary  source.  Sepsis appears to have stabilized as lactic acidosis has since resolved.  Blood pressure stable.  Count and procalcitonin level trending downward.  E. coli noted to be pansensitive so switched to Ancef.  By day of discharge, had completed 3 days of IV antibiotics and will be discharged on 2 more days of p.o. ? ?Acute on chronic diastolic CHF (congestive heart failure) ([place]) ?Elevated BNP at 500 even prior to patient getting her IV fluids.  Have stopped IV fluids and had some diuresis. ? ?HTN (hypertension) ?Pressure stable.  Continue home medications ? ?Chronic kidney disease, stage 3b ([place]) ?No previous labs to compare to other than creatinine earlier this month at 1.83 with GFR of 29.  By day of discharge following Lasix, creatinine at 1.84. ? ?Rash and nonspecific skin eruption ?Patient states that rash started a few days ago.  It is nontender.  She is not on any anticoagulation or aspirin.  She did not fall and injure herself.  She says it does not itch and she does not feel like it is gotten worse.  Suspect if this was antifungal.  Patient started on Diflucan on 3/27.  By 3/28, had marked improvement in most of erythema resolved.. ? ? ? ?Diarrhea ?Possible viral gastroenteritis.  GI panel unremarkable.  Resolved.  Patient able tolerate p.o. ? ?Hypokalemia ?Replacing as needed.  Secondary to dehydration plus diarrhea. ? ?Depression with anxiety ?Continue home medications ? ?Morbid obesity ([place]) ?Patient meets criteria BMI greater than 40. ? ? ? ? ?  ? ? ?Consultants: None ?  Procedures performed: None ?Disposition: Home ?Diet recommendation:  ?Discharge Diet Orders (From admission, onward)  ? ?  Start     Ordered  ? 05/07/21 0000  Diet - low sodium heart healthy       ? 05/07/21 1547  ? ?  ?  ? ?  ? ?Cardiac and Carb modified diet ?DISCHARGE MEDICATION: ?Allergies as of 05/07/2021   ? ?   Reactions  ? Other Other (See Comments)  ? DO NOT USE BLOOD PRESSURE CUFF OR NEEDLES (IVs) in RIGHT ARM  ? ?  ? ?   ?Medication List  ?  ? ?TAKE these medications   ? ?albuterol 108 (90 Base) MCG/ACT inhaler ?Commonly known as: VENTOLIN HFA ?Inhale 2 puffs into the lungs every 4 (four) hours as needed for wheezing or shortness of breath. ?  ?ALPRAZolam 0.5 MG tablet ?Commonly known as: Duanne Moron ?Take 0.5 mg by mouth 2 (two) times daily as needed for anxiety (pt takes at bedtime each night). ?  ?calcitRIOL 0.25 MCG capsule ?Commonly known as: ROCALTROL ?Take 0.25 mcg by mouth daily. ?  ?Calcium Carb-Cholecalciferol 600-800 MG-UNIT Tabs ?Take 1 tablet by mouth daily. ?  ?carvedilol 3.125 MG tablet ?Commonly known as: COREG ?Take 3.125 mg by mouth 2 (two) times daily. ?  ?cefdinir 300 MG capsule ?Commonly known as: OMNICEF ?Take 1 capsule (300 mg total) by mouth 2 (two) times daily. ?  ?citalopram 20 MG tablet ?Commonly known as: CELEXA ?Take 40 mg by mouth every morning. ?  ?Farxiga 10 MG Tabs tablet ?Generic drug: dapagliflozin propanediol ?Take 10 mg by mouth every morning. ?  ?fluconazole 100 MG tablet ?Commonly known as: DIFLUCAN ?Take 1 tablet (100 mg total) by mouth daily for 2 days. ?  ?fluticasone 50 MCG/ACT nasal spray ?Commonly known as: FLONASE ?Place 2 sprays into both nostrils daily. ?  ?mometasone 0.1 % cream ?Commonly known as: ELOCON ?Apply 1 application topically daily. ?  ?montelukast 10 MG tablet ?Commonly known as: SINGULAIR ?Take 10 mg by mouth at bedtime. ?  ?NON FORMULARY ?Place 1 drop into the right eye 2 (two) times daily. Imprimis Eye Drop ?  ?omeprazole 20 MG capsule ?Commonly known as: PRILOSEC ?Take 20 mg by mouth 2 (two) times daily. ?  ?potassium chloride SA 20 MEQ tablet ?Commonly known as: KLOR-CON M ?Take 1 tablet (20 mEq total) by mouth daily for 3 days. ?  ?torsemide 20 MG tablet ?Commonly known as: DEMADEX ?Take 20 mg by mouth daily. ?  ?triamcinolone 0.025 % cream ?Commonly known as: KENALOG ?Apply 1 application topically 2 (two) times daily. ?  ?TYLENOL ALLERGY COMPLETE PO ?Take 2 tablets by  mouth at bedtime. ?  ?valsartan 160 MG tablet ?Commonly known as: DIOVAN ?Take 160 mg by mouth daily. ?  ?venlafaxine XR 150 MG 24 hr capsule ?Commonly known as: EFFEXOR-XR ?Take 150 mg by mouth daily. ?  ? ?  ? ? ?Discharge Exam: ?Filed Weights  ? 05/04/21 1045  ?Weight: 113.4 kg  ? ?General: Alert and oriented x3 ?Cardiovascular: Regular rate and rhythm, S1-S2 ?Lungs: Clear to auscultation bilaterally ? ?Condition at discharge: good ? ?The results of significant diagnostics from this hospitalization (including imaging, microbiology, ancillary and laboratory) are listed below for reference.  ? ?Imaging Studies: ?CT ABDOMEN PELVIS WO CONTRAST ? ?Result Date: 04/11/2021 ?CLINICAL DATA:  Abdominal pain, acute, nonlocalized. UTI. Pt states she was told by her primary to come the ED for an IV to receive abx. Pt denies pain EXAM: CT ABDOMEN AND  PELVIS WITHOUT CONTRAST TECHNIQUE: Multidetector CT imaging of the abdomen and pelvis was performed following the standard protocol without IV contrast. RADIATION DOSE REDUCTION: This exam was performed according to the departmental dose-optimization program which includes automated exposure control, adjustment of the mA and/or kV according to patient size and/or use of iterative reconstruction technique. COMPARISON:  None. FINDINGS: Lower chest: No acute abnormality. Severe mitral annular calcification. Three-vessel coronary calcification. Hepatobiliary: No focal liver abnormality. Status post cholecystectomy. No biliary dilatation. Pancreas: Diffusely atrophic. No focal lesion. Otherwise normal pancreatic contour. No surrounding inflammatory changes. No main pancreatic ductal dilatation. Spleen: Normal in size without focal abnormality. Adrenals/Urinary Tract: No adrenal nodule bilaterally. Query punctate nephrolithiasis. No hydronephrosis. No definite contour-deforming renal mass. No ureterolithiasis or hydroureter. The urinary bladder is unremarkable. Stomach/Bowel:  Gastroesophageal and gastric surgical changes noted. Stomach is within normal limits. No evidence of bowel wall thickening or dilatation. Appendix appears normal. Vascular/Lymphatic: No abdominal aorta or iliac aneurysm

## 2021-05-07 NOTE — Consult Note (Signed)
? ?  Heart Failure Nurse Navigator Note ? ?HFpEF 55 to 60%.  Grade 2 diastolic dysfunction.  Moderate mitral regurgitation. ? ?She presented to the emergency room on March 25 with complaints of fever and diarrhea.  Chest x-ray did not show any acute disease.  BNP was elevated at 315. ? ?Comorbidities: ? ?Hypertension ?Asthma ?Depression anxiety ?Obstructive sleep apnea without CPAP use ?Obesity ?History of breast cancer status postchemotherapy ?Chronic kidney disease ? ?Medications: ? ?Aspirin 81 mg daily ?Carvedilol 3.125 mg 2 times daily ?Furosemide 20 mg IV daily ?Potassium chloride 40 mEq daily ? ? ?Labs: ? ?Sodium 138, potassium 3, chloride 105, CO2 22, BUN 31, creatinine 1.84, estimated GFR 28 ?Weight not documented ?I&O not documented ?Blood pressure 139/79 ? ? ?Initial meeting with patient today she is lying in bed in no acute distress. ? ?She states that she has heard the term failure before, when she had an appointment with Dr. Marianna Payment physicians assistant. ? ?Again touched on her heart failure with preserved ejection fraction/diastolic dysfunction. ? ?Went over her diet.  She admits to using salt when she cooks and also using salt at the table as she is not sure she can give it up.  Explained the relationship between salt/sodium and fluids.  She voices understanding. ? ?Discussed also fluid restriction and daily weights.  Also went over what to report. ? ?Made aware of the outpatient heart failure clinic.  She is given an appointment for April 4 at 2:30 in the afternoon.  She questions if this would clash with one of her husband's appointments, asked that she call the clinic and make them aware and they can change the appointment.  She voices understanding.  She has a 0% no-show rating. ? ?She was given the living heart failure teaching booklet, zone magnet and info on sodium. ? ?She had no further questions. ? ?Pricilla Riffle RN CHFN ?

## 2021-05-09 LAB — CULTURE, BLOOD (ROUTINE X 2)
Culture: NO GROWTH
Culture: NO GROWTH

## 2021-05-09 LAB — CULTURE, GROUP A STREP (THRC)

## 2021-05-10 ENCOUNTER — Telehealth: Payer: Self-pay

## 2021-05-10 NOTE — Telephone Encounter (Addendum)
[facility] Discharge Phone call ? ?Called and spoke directly with Manuela Schwartz.  He states that she feels she is doing well except she is having trouble with keeping track of the days of the week. ? ?She is weighing herself every morning and is up 1 pound. ? ?Went over her medications and he is taking as ordered except for the torsemide which she is not taking on a daily basis.  She said when she takes it that it makes her urinate a lot.  She states the hall to her bathroom is very long.  I told her that was the main function of that medication.  She is going to keep track of her weights and if she goes up 2 to 3 pounds overnight that she will make sure that she takes it. ? ?She states she has not picked up the saltshaker since she has been discharged to home. ? ?Discussed her follow-up in the outpatient heart failure clinic on April 4 at 2:30 in the afternoon.  She had no further questions. ? ?Pricilla Riffle RN CHFN ?

## 2021-05-13 NOTE — Progress Notes (Signed)
? Patient ID: Veronica Graham, female    DOB: 1945-02-15, 76 y.o.   MRN: [id number] ? ?HPI ? ?Ms Stemmer is a 76 y/o female with a history of asthma, breast cancer, HTN, CKD, arthritis, depression, sleep apnea and chronic heart failure.  ? ?Echo report from 05/28/20 reviewed and showed an EF of 55-50% along with mild LVH/ LAE & moderate MR.  ? ?Admitted 05/02/21 due to complaints of fever, urinary frequency and diarrhea times several days. Found to be in severe sepsis felt to be secondary to a urinary tract infection. Given IVF and antibiotics. BNP elevated so diuresis given. PT evaluation done. Discharged after 3 days.  ? ?She presents today for her initial visit with a chief complaint of moderate fatigue upon minimal exertion. Says this has been chronic in nature having been present for several months since she had covid January 2023. She has associated decreased appetite, shortness of breath and pedal edema along with this. She denies any difficulty sleeping, dizziness, abdominal distention, palpitations, chest pain, cough or weight gain.  ? ?Is aware that her potassium level was low during recent admission and was curious as to why she wasn't discharged with any potassium supplements.  ? ?Past Medical History:  ?Diagnosis Date  ? Arthritis   ? Asthma   ? Breast cancer ([place]) 2003  ? right breast  ? Breast cancer ([place])   ? CHF (congestive heart failure) ([place])   ? Chronic kidney disease   ? Depression   ? Environmental and seasonal allergies   ? HOH (hard of hearing)   ? Hypertension   ? Lower extremity edema   ? Obesity   ? Personal history of chemotherapy 2003  ? Rt. breast  ? Sinus headache   ? Sleep apnea   ? does not use CPAP  ? ?Past Surgical History:  ?Procedure Laterality Date  ? APPENDECTOMY    ? BREAST EXCISIONAL BIOPSY Right 2003  ? positve  ? BTL    ? CATARACT EXTRACTION W/PHACO Right 03/31/2017  ? Procedure: CATARACT EXTRACTION PHACO AND INTRAOCULAR LENS PLACEMENT (IOC);  Surgeon: Birder Robson, MD;   Location: [facility] ORS;  Service: Ophthalmology;  Laterality: Right;  [place] 00:38.2 ?AP% 18.2 ?CDE 6.95 ?Fluid Pcak lot # [id number]  ? CATARACT EXTRACTION W/PHACO Left 04/22/2017  ? Procedure: CATARACT EXTRACTION PHACO AND INTRAOCULAR LENS PLACEMENT (IOC);  Surgeon: Birder Robson, MD;  Location: [facility];  Service: Ophthalmology;  Laterality: Left;  [place] 00:39.0 ?AP% 12.0 ?CDE 4.68 ?Fluid Pack Lot # [id number]  ? CHOLECYSTECTOMY    ? GASTRIC BYPASS    ? JOINT REPLACEMENT    ? MASTECTOMY, PARTIAL    ? REPLACEMENT TOTAL KNEE BILATERAL    ? ?Family History  ?Problem Relation Age of Onset  ? Heart failure Mother   ? COPD Father   ? CAD Brother   ? ?Social History  ? ?Tobacco Use  ? Smoking status: Never  ? Smokeless tobacco: Never  ?Substance Use Topics  ? Alcohol use: No  ? ?Allergies  ?Allergen Reactions  ? Other Other (See Comments)  ?  DO NOT USE BLOOD PRESSURE CUFF OR NEEDLES (IVs) in RIGHT ARM  ? ?Prior to Admission medications   ?Medication Sig Start Date End Date Taking? Authorizing Provider  ?albuterol (PROVENTIL HFA;VENTOLIN HFA) 108 (90 Base) MCG/ACT inhaler Inhale 2 puffs into the lungs every 4 (four) hours as needed for wheezing or shortness of breath.   Yes [provider]  ?ALPRAZolam Duanne Moron) 0.5 MG tablet Take  0.5 mg by mouth 2 (two) times daily as needed for anxiety (pt takes at bedtime each night).   Yes [provider]  ?calcitRIOL (ROCALTROL) 0.25 MCG capsule Take 0.25 mcg by mouth daily. 04/22/21  Yes [provider]  ?carvedilol (COREG) 3.125 MG tablet Take 3.125 mg by mouth 2 (two) times daily. 03/21/21  Yes [provider]  ?Chlorphen-Pseudoephed-APAP (TYLENOL ALLERGY COMPLETE PO) Take 2 tablets by mouth at bedtime.   Yes [provider]  ?citalopram (CELEXA) 20 MG tablet Take 10 mg by mouth daily.   Yes [provider]  ?FARXIGA 10 MG TABS tablet Take 10 mg by mouth every morning. 04/23/21  Yes [provider]  ?fluticasone (FLONASE) 50 MCG/ACT  nasal spray Place 2 sprays into both nostrils daily.   Yes [provider]  ?mometasone (ELOCON) 0.1 % cream Apply 1 application topically daily.   Yes [provider]  ?montelukast (SINGULAIR) 10 MG tablet Take 10 mg by mouth at bedtime.   Yes [provider]  ?NON FORMULARY Place 1 drop into the right eye 2 (two) times daily. Imprimis Eye Drop   Yes [provider]  ?omeprazole (PRILOSEC) 20 MG capsule Take 20 mg by mouth 2 (two) times daily. 02/18/21  Yes [provider]  ?torsemide (DEMADEX) 20 MG tablet Take 10 mg by mouth daily. 03/21/21  Yes [provider]  ?triamcinolone (KENALOG) 0.025 % cream Apply 1 application topically 2 (two) times daily.   Yes [provider]  ?valsartan (DIOVAN) 160 MG tablet Take 160 mg by mouth daily. 01/03/20  Yes [provider]  ?venlafaxine XR (EFFEXOR-XR) 150 MG 24 hr capsule Take 150 mg by mouth daily. 02/20/21  Yes [provider]  ?potassium chloride SA (KLOR-CON M) 20 MEQ tablet Take 1 tablet (20 mEq total) by mouth daily for 3 days. ?Patient not taking: Reported on 05/14/2021 04/11/21 04/14/21  Vanessa Rosendale, MD  ? ?Review of Systems  ?Constitutional:  Positive for appetite change (get full easily) and fatigue (easily).  ?HENT:  Positive for hearing loss. Negative for congestion and sore throat.   ?Eyes: Negative.   ?Respiratory:  Positive for shortness of breath. Negative for cough and wheezing.   ?Cardiovascular:  Positive for leg swelling. Negative for chest pain and palpitations.  ?Gastrointestinal:  Negative for abdominal distention and abdominal pain.  ?Endocrine: Negative.   ?Genitourinary: Negative.   ?Musculoskeletal:  Negative for back pain and neck pain.  ?Skin: Negative.   ?Allergic/Immunologic: Negative.   ?Neurological:  Negative for dizziness and light-headedness.  ?Hematological:  Negative for adenopathy. Does not bruise/bleed easily.  ?Psychiatric/Behavioral:  Negative for dysphoric  mood and sleep disturbance. The patient is not nervous/anxious.   ? ?Vitals:  ? 05/14/21 1424  ?BP: 125/90  ?Pulse: 66  ?Resp: 16  ?SpO2: 100%  ?Weight: 242 lb (109.8 kg)  ?Height: '5\' 4"'$  (1.626 m)  ? ?Wt Readings from Last 3 Encounters:  ?05/14/21 242 lb (109.8 kg)  ?05/04/21 250 lb (113.4 kg)  ?04/11/21 294 lb 15.6 oz (133.8 kg)  ? ?Lab Results  ?Component Value Date  ? CREATININE 1.84 (H) 05/07/2021  ? CREATININE 1.70 (H) 05/06/2021  ? CREATININE 1.63 (H) 05/05/2021  ? ?Physical Exam ?Vitals and nursing note reviewed. Exam conducted with a chaperone present (daughter-in-law).  ?Constitutional:   ?   Appearance: Normal appearance.  ?HENT:  ?   Head: Normocephalic and atraumatic.  ?   Right Ear: Decreased hearing noted.  ?   Left  Ear: Decreased hearing noted.  ?Cardiovascular:  ?   Rate and Rhythm: Normal rate and regular rhythm.  ?Pulmonary:  ?   Effort: Pulmonary effort is normal. No respiratory distress.  ?   Breath sounds: No wheezing or rales.  ?Abdominal:  ?   General: There is no distension.  ?   Palpations: Abdomen is soft.  ?   Tenderness: There is no abdominal tenderness.  ?Musculoskeletal:     ?   General: No tenderness.  ?   Cervical back: Normal range of motion and neck supple.  ?   Right lower leg: Edema (trace pitting) present.  ?   Left lower leg: Edema (trace pitting) present.  ?Skin: ?   General: Skin is warm and dry.  ?Neurological:  ?   General: No focal deficit present.  ?   Mental Status: She is oriented to person, place, and time.  ?Psychiatric:     ?   Mood and Affect: Mood normal.     ?   Behavior: Behavior normal.     ?   Thought Content: Thought content normal.  ? ?Assessment & Plan: ? 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

## 2021-05-14 ENCOUNTER — Ambulatory Visit (HOSPITAL_BASED_OUTPATIENT_CLINIC_OR_DEPARTMENT_OTHER): Payer: PRIVATE HEALTH INSURANCE | Admitting: Family

## 2021-05-14 ENCOUNTER — Telehealth: Payer: Self-pay | Admitting: Family

## 2021-05-14 ENCOUNTER — Other Ambulatory Visit
Admission: RE | Admit: 2021-05-14 | Discharge: 2021-05-14 | Disposition: A | Discharge status: Home / Self Care | Payer: PRIVATE HEALTH INSURANCE | Source: Ambulatory Visit | Attending: Family | Admitting: Family

## 2021-05-14 ENCOUNTER — Encounter: Payer: Self-pay | Admitting: Family

## 2021-05-14 VITALS — BP 125/90 | HR 66 | Resp 16 | Ht 64.0 in | Wt 242.0 lb

## 2021-05-14 DIAGNOSIS — M1990 Unspecified osteoarthritis, unspecified site: Secondary | ICD-10-CM | POA: Insufficient documentation

## 2021-05-14 DIAGNOSIS — Z8616 Personal history of COVID-19: Secondary | ICD-10-CM | POA: Insufficient documentation

## 2021-05-14 DIAGNOSIS — F32A Depression, unspecified: Secondary | ICD-10-CM | POA: Insufficient documentation

## 2021-05-14 DIAGNOSIS — I130 Hypertensive heart and chronic kidney disease with heart failure and stage 1 through stage 4 chronic kidney disease, or unspecified chronic kidney disease: Secondary | ICD-10-CM | POA: Insufficient documentation

## 2021-05-14 DIAGNOSIS — I5042 Chronic combined systolic (congestive) and diastolic (congestive) heart failure: Secondary | ICD-10-CM | POA: Insufficient documentation

## 2021-05-14 DIAGNOSIS — R0602 Shortness of breath: Secondary | ICD-10-CM | POA: Insufficient documentation

## 2021-05-14 DIAGNOSIS — G4733 Obstructive sleep apnea (adult) (pediatric): Secondary | ICD-10-CM

## 2021-05-14 DIAGNOSIS — N189 Chronic kidney disease, unspecified: Secondary | ICD-10-CM | POA: Insufficient documentation

## 2021-05-14 DIAGNOSIS — Z7984 Long term (current) use of oral hypoglycemic drugs: Secondary | ICD-10-CM | POA: Insufficient documentation

## 2021-05-14 DIAGNOSIS — I10 Essential (primary) hypertension: Secondary | ICD-10-CM

## 2021-05-14 DIAGNOSIS — R609 Edema, unspecified: Secondary | ICD-10-CM | POA: Insufficient documentation

## 2021-05-14 DIAGNOSIS — G4730 Sleep apnea, unspecified: Secondary | ICD-10-CM | POA: Insufficient documentation

## 2021-05-14 DIAGNOSIS — J45909 Unspecified asthma, uncomplicated: Secondary | ICD-10-CM | POA: Insufficient documentation

## 2021-05-14 DIAGNOSIS — R5383 Other fatigue: Secondary | ICD-10-CM | POA: Insufficient documentation

## 2021-05-14 DIAGNOSIS — F329 Major depressive disorder, single episode, unspecified: Secondary | ICD-10-CM | POA: Diagnosis not present

## 2021-05-14 LAB — BASIC METABOLIC PANEL
Anion gap: 9 (ref 5–15)
BUN: 22 mg/dL (ref 8–23)
CO2: 22 mmol/L (ref 22–32)
Calcium: 8.7 mg/dL — ABNORMAL LOW (ref 8.9–10.3)
Chloride: 111 mmol/L (ref 98–111)
Creatinine, Ser: 1.34 mg/dL — ABNORMAL HIGH (ref 0.44–1.00)
GFR, Estimated: 41 mL/min — ABNORMAL LOW (ref >60)
Glucose, Bld: 103 mg/dL — ABNORMAL HIGH (ref 70–99)
Potassium: 3.2 mmol/L — ABNORMAL LOW (ref 3.5–5.1)
Sodium: 142 mmol/L (ref 135–145)

## 2021-05-14 MED ORDER — POTASSIUM CHLORIDE CRYS ER 20 MEQ PO TBCR: 20.0000 meq | EXTENDED_RELEASE_TABLET | Freq: Every day | ORAL | 5 refills | Status: DC

## 2021-05-14 NOTE — Patient Instructions (Addendum)
Continue weighing daily and call for an overnight weight gain of 3 pounds or more or a weekly weight gain of more than 5 pounds.   If you have voicemail, please make sure your mailbox is cleaned out so that we may leave a message and please make sure to listen to any voicemails.     

## 2021-05-14 NOTE — Telephone Encounter (Signed)
Spoke with patient regarding BMP results obtained earlier today. Renal function is improving from hospital discharge. Potassium level is still low at 3.0 so will send in RX for potassium 81mq daily and will recheck labs at her next visit.  ? ?Patient verbalized understanding. ?

## 2021-06-25 ENCOUNTER — Ambulatory Visit: Payer: PRIVATE HEALTH INSURANCE | Admitting: Family

## 2021-06-25 NOTE — Progress Notes (Signed)
Patient ID: Veronica Graham, female    DOB: Dec 13, 1945, 76 y.o.   MRN: [id number]  HPI  Veronica Graham is a 76 y/o female with a history of asthma, breast cancer, HTN, CKD, arthritis, depression, sleep apnea and chronic heart failure.   Echo report from 05/28/20 reviewed and showed an EF of 55-50% along with mild LVH/ LAE & moderate MR.   Admitted 05/02/21 due to complaints of fever, urinary frequency and diarrhea times several days. Found to be in severe sepsis felt to be secondary to a urinary tract infection. Given IVF and antibiotics. BNP elevated so diuresis given. PT evaluation done. Discharged after 3 days.   She presents today for a follow-up visit with a chief complaint of moderate fatigue upon minimal exertion. She describes this as chronic in nature. She has associated shortness of breath, pedal edema and gradual weight gain along with this. She denies any difficulty sleeping, dizziness, abdominal distention, palpitations, chest pain, wheezing or cough.   Says that she hasn't been weighing daily because her scales broke but is planning to go to [place] and get another set soon. Not adding any salt to her food. Admits that she's probably eating too late in the evening which is contributing to her weight gain.   Past Medical History:  Diagnosis Date   Arthritis    Asthma    Breast cancer ([place]) 2003   right breast   Breast cancer ([place])    CHF (congestive heart failure) ([place])    Chronic kidney disease    Depression    Environmental and seasonal allergies    HOH (hard of hearing)    Hypertension    Lower extremity edema    Obesity    Personal history of chemotherapy 2003   Rt. breast   Sinus headache    Sleep apnea    does not use CPAP   Past Surgical History:  Procedure Laterality Date   APPENDECTOMY     BREAST EXCISIONAL BIOPSY Right 2003   positve   BTL     CATARACT EXTRACTION W/PHACO Right 03/31/2017   Procedure: CATARACT EXTRACTION PHACO AND INTRAOCULAR LENS PLACEMENT ([place]);   Surgeon: Birder Robson, MD;  Location: [facility];  Service: Ophthalmology;  Laterality: Right;  [place] 00:38.2 AP% 18.2 CDE 6.95 Fluid Pcak lot # [id number]   CATARACT EXTRACTION W/PHACO Left 04/22/2017   Procedure: CATARACT EXTRACTION PHACO AND INTRAOCULAR LENS PLACEMENT (IOC);  Surgeon: Birder Robson, MD;  Location: [facility];  Service: Ophthalmology;  Laterality: Left;  [place] 00:39.0 AP% 12.0 CDE 4.68 Fluid Pack Lot # [id number]   CHOLECYSTECTOMY     GASTRIC BYPASS     JOINT REPLACEMENT     MASTECTOMY, PARTIAL     REPLACEMENT TOTAL KNEE BILATERAL     Family History  Problem Relation Age of Onset   Heart failure Mother    COPD Father    CAD Brother    Social History   Tobacco Use   Smoking status: Never   Smokeless tobacco: Never  Substance Use Topics   Alcohol use: No   Allergies  Allergen Reactions   Other Other (See Comments)    DO NOT USE BLOOD PRESSURE CUFF OR NEEDLES (IVs) in RIGHT ARM   Prior to Admission medications   Medication Sig Start Date End Date Taking? Authorizing Provider  albuterol (PROVENTIL HFA;VENTOLIN HFA) 108 (90 Base) MCG/ACT inhaler Inhale 2 puffs into the lungs every 4 (four) hours as needed for wheezing or shortness of breath.  Yes [provider]  ALPRAZolam Duanne Moron) 0.5 MG tablet Take 0.5 mg by mouth 2 (two) times daily as needed for anxiety (pt takes at bedtime each night).   Yes [provider]  calcitRIOL (ROCALTROL) 0.25 MCG capsule Take 0.25 mcg by mouth daily. 04/22/21  Yes [provider]  carvedilol (COREG) 3.125 MG tablet Take 3.125 mg by mouth 2 (two) times daily. 03/21/21  Yes [provider]  Chlorphen-Pseudoephed-APAP (TYLENOL ALLERGY COMPLETE PO) Take 2 tablets by mouth at bedtime.   Yes [provider]  citalopram (CELEXA) 20 MG tablet Take 10 mg by mouth daily.   Yes [provider]  FARXIGA 10 MG TABS tablet Take 10 mg by mouth every morning. 04/23/21  Yes [provider]   montelukast (SINGULAIR) 10 MG tablet Take 10 mg by mouth at bedtime.   Yes [provider]  omeprazole (PRILOSEC) 20 MG capsule Take 20 mg by mouth 2 (two) times daily. 02/18/21  Yes [provider]  potassium chloride SA (KLOR-CON M) 20 MEQ tablet Take 1 tablet (20 mEq total) by mouth daily. 05/14/21  Yes Nirali Magouirk, Otila Kluver A, FNP  torsemide (DEMADEX) 20 MG tablet Take 10 mg by mouth daily. 03/21/21  Yes [provider]  valsartan (DIOVAN) 160 MG tablet Take 160 mg by mouth daily. 01/03/20  Yes [provider]  venlafaxine XR (EFFEXOR-XR) 150 MG 24 hr capsule Take 150 mg by mouth daily. 02/20/21  Yes [provider]  NON FORMULARY Place 1 drop into the right eye 2 (two) times daily. Imprimis Eye Drop Patient not taking: Reported on 06/26/2021    [provider]    Review of Systems  Constitutional:  Positive for fatigue (easily). Negative for appetite change.  HENT:  Positive for hearing loss. Negative for congestion and sore throat.   Eyes: Negative.   Respiratory:  Positive for shortness of breath. Negative for cough and wheezing.   Cardiovascular:  Positive for leg swelling. Negative for chest pain and palpitations.  Gastrointestinal:  Negative for abdominal distention and abdominal pain.  Endocrine: Negative.   Genitourinary: Negative.   Musculoskeletal:  Negative for back pain and neck pain.  Skin: Negative.   Allergic/Immunologic: Negative.   Neurological:  Negative for dizziness and light-headedness.  Hematological:  Negative for adenopathy. Does not bruise/bleed easily.  Psychiatric/Behavioral:  Negative for dysphoric mood and sleep disturbance. The patient is not nervous/anxious.    Vitals:   06/26/21 1409  BP: (!) 158/78  Pulse: 75  Resp: 18  SpO2: 100%  Weight: 249 lb 6 oz (113.1 kg)  Height: '5\' 2"'$  (1.575 m)   Wt Readings from Last 3 Encounters:  06/26/21 249 lb 6 oz (113.1 kg)  05/14/21 242 lb (109.8 kg)  05/04/21 250 lb  (113.4 kg)   Lab Results  Component Value Date   CREATININE 1.34 (H) 05/14/2021   CREATININE 1.84 (H) 05/07/2021   CREATININE 1.70 (H) 05/06/2021   Physical Exam Vitals and nursing note reviewed.  Constitutional:      Appearance: Normal appearance.  HENT:     Head: Normocephalic and atraumatic.     Right Ear: Decreased hearing noted.     Left Ear: Decreased hearing noted.  Cardiovascular:     Rate and Rhythm: Normal rate and regular rhythm.  Pulmonary:     Effort: Pulmonary effort is normal. No respiratory distress.     Breath sounds: No wheezing or rales.  Abdominal:     General: There is no distension.  Palpations: Abdomen is soft.     Tenderness: There is no abdominal tenderness.  Musculoskeletal:        General: No tenderness.     Cervical back: Normal range of motion and neck supple.     Right lower leg: Edema (trace pitting) present.     Left lower leg: Edema (trace pitting) present.  Skin:    General: Skin is warm and dry.  Neurological:     General: No focal deficit present.     Mental Status: She is oriented to person, place, and time.  Psychiatric:        Mood and Affect: Mood normal.        Behavior: Behavior normal.        Thought Content: Thought content normal.   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 (LVH/LAE)- - NYHA class III - euvolemic today - not weighing daily as her scales broke but she's planning to get a new set soon; reminded to call for an overnight weight gain of > 2 pounds or weekly gain > 5 pounds - weight up 7 pounds from last visit here 6 weeks ago - not adding salt and trying to be mindful of sodium content of foods - on GDMT of farxiga - saw cardiology (White) 06/17/21 - BNP 05/06/21 was 317.1  2: HTN with CKD- - BP mildly elevated (158/78) - saw nephrology Holley Raring) 04/22/21 - BMP 05/14/21 reviewed and showed sodium 142, potassium 3.2, creatinine 1.34 and GFR 41 - will recheck  BMP today  3: Depression- - saw PCP Baldemar Lenis) 05/15/21 - stable at this time  4: Sleep apnea- - sleeping well but does not wear CPAP - saw pulmonology Raul Del) 06/25/21   Medication list reviewed.   Return in 4 months, sooner if needed.

## 2021-06-26 ENCOUNTER — Ambulatory Visit: Payer: PRIVATE HEALTH INSURANCE | Attending: Family | Admitting: Family

## 2021-06-26 ENCOUNTER — Other Ambulatory Visit: Payer: Self-pay

## 2021-06-26 ENCOUNTER — Encounter: Payer: Self-pay | Admitting: Family

## 2021-06-26 VITALS — BP 158/78 | HR 75 | Resp 18 | Ht 62.0 in | Wt 249.4 lb

## 2021-06-26 DIAGNOSIS — N189 Chronic kidney disease, unspecified: Secondary | ICD-10-CM | POA: Diagnosis not present

## 2021-06-26 DIAGNOSIS — G4733 Obstructive sleep apnea (adult) (pediatric): Secondary | ICD-10-CM | POA: Diagnosis not present

## 2021-06-26 DIAGNOSIS — M1990 Unspecified osteoarthritis, unspecified site: Secondary | ICD-10-CM | POA: Diagnosis not present

## 2021-06-26 DIAGNOSIS — I5042 Chronic combined systolic (congestive) and diastolic (congestive) heart failure: Secondary | ICD-10-CM | POA: Diagnosis not present

## 2021-06-26 DIAGNOSIS — I10 Essential (primary) hypertension: Secondary | ICD-10-CM

## 2021-06-26 DIAGNOSIS — G4730 Sleep apnea, unspecified: Secondary | ICD-10-CM | POA: Diagnosis not present

## 2021-06-26 DIAGNOSIS — J45909 Unspecified asthma, uncomplicated: Secondary | ICD-10-CM | POA: Diagnosis not present

## 2021-06-26 DIAGNOSIS — I5032 Chronic diastolic (congestive) heart failure: Secondary | ICD-10-CM | POA: Diagnosis present

## 2021-06-26 DIAGNOSIS — F329 Major depressive disorder, single episode, unspecified: Secondary | ICD-10-CM

## 2021-06-26 DIAGNOSIS — I130 Hypertensive heart and chronic kidney disease with heart failure and stage 1 through stage 4 chronic kidney disease, or unspecified chronic kidney disease: Secondary | ICD-10-CM | POA: Insufficient documentation

## 2021-06-26 DIAGNOSIS — F32A Depression, unspecified: Secondary | ICD-10-CM | POA: Diagnosis not present

## 2021-06-26 DIAGNOSIS — R5383 Other fatigue: Secondary | ICD-10-CM | POA: Diagnosis not present

## 2021-06-26 DIAGNOSIS — Z853 Personal history of malignant neoplasm of breast: Secondary | ICD-10-CM | POA: Insufficient documentation

## 2021-06-26 LAB — BASIC METABOLIC PANEL
Anion gap: 8 (ref 5–15)
BUN: 25 mg/dL — ABNORMAL HIGH (ref 8–23)
CO2: 20 mmol/L — ABNORMAL LOW (ref 22–32)
Calcium: 8.7 mg/dL — ABNORMAL LOW (ref 8.9–10.3)
Chloride: 113 mmol/L — ABNORMAL HIGH (ref 98–111)
Creatinine, Ser: 1.51 mg/dL — ABNORMAL HIGH (ref 0.44–1.00)
GFR, Estimated: 36 mL/min — ABNORMAL LOW (ref >60)
Glucose, Bld: 88 mg/dL (ref 70–99)
Potassium: 3.9 mmol/L (ref 3.5–5.1)
Sodium: 141 mmol/L (ref 135–145)

## 2021-06-26 NOTE — Patient Instructions (Signed)
Continue weighing daily and call for an overnight weight gain of 3 pounds or more or a weekly weight gain of more than 5 pounds.   If you have voicemail, please make sure your mailbox is cleaned out so that we may leave a message and please make sure to listen to any voicemails.     

## 2021-06-27 ENCOUNTER — Encounter: Payer: Self-pay | Admitting: Family

## 2021-06-27 ENCOUNTER — Telehealth: Payer: Self-pay

## 2021-06-27 NOTE — Telephone Encounter (Addendum)
Called patient to review lab results. Patient verbalized understanding and had no questions or concerns at this time.  Georg Ruddle, RN ----- Message from Alisa Graff, [place] sent at 06/27/2021  8:07 AM EDT ----- Potassium/ sodium levels are normal. Kidney function is within her range

## 2021-08-01 ENCOUNTER — Other Ambulatory Visit: Payer: Self-pay | Admitting: Family Medicine

## 2021-08-01 DIAGNOSIS — Z1231 Encounter for screening mammogram for malignant neoplasm of breast: Secondary | ICD-10-CM

## 2021-08-30 ENCOUNTER — Ambulatory Visit
Admission: RE | Admit: 2021-08-30 | Discharge: 2021-08-30 | Disposition: A | Discharge status: Home / Self Care | Payer: Medicare Other | Source: Ambulatory Visit | Attending: Family Medicine | Admitting: Family Medicine

## 2021-08-30 DIAGNOSIS — Z1231 Encounter for screening mammogram for malignant neoplasm of breast: Secondary | ICD-10-CM | POA: Insufficient documentation

## 2021-09-06 ENCOUNTER — Other Ambulatory Visit: Payer: Self-pay | Admitting: Family

## 2021-10-28 ENCOUNTER — Encounter: Payer: Self-pay | Admitting: Family

## 2021-10-28 ENCOUNTER — Ambulatory Visit: Payer: PRIVATE HEALTH INSURANCE | Attending: Family | Admitting: Family

## 2021-10-28 VITALS — BP 148/79 | HR 69 | Resp 18 | Ht 62.0 in | Wt 242.0 lb

## 2021-10-28 DIAGNOSIS — G4730 Sleep apnea, unspecified: Secondary | ICD-10-CM | POA: Insufficient documentation

## 2021-10-28 DIAGNOSIS — I5032 Chronic diastolic (congestive) heart failure: Secondary | ICD-10-CM | POA: Diagnosis present

## 2021-10-28 DIAGNOSIS — I5042 Chronic combined systolic (congestive) and diastolic (congestive) heart failure: Secondary | ICD-10-CM

## 2021-10-28 DIAGNOSIS — I129 Hypertensive chronic kidney disease with stage 1 through stage 4 chronic kidney disease, or unspecified chronic kidney disease: Secondary | ICD-10-CM

## 2021-10-28 DIAGNOSIS — I130 Hypertensive heart and chronic kidney disease with heart failure and stage 1 through stage 4 chronic kidney disease, or unspecified chronic kidney disease: Secondary | ICD-10-CM | POA: Diagnosis not present

## 2021-10-28 DIAGNOSIS — F32A Depression, unspecified: Secondary | ICD-10-CM | POA: Diagnosis not present

## 2021-10-28 DIAGNOSIS — N189 Chronic kidney disease, unspecified: Secondary | ICD-10-CM | POA: Insufficient documentation

## 2021-10-28 DIAGNOSIS — Z79899 Other long term (current) drug therapy: Secondary | ICD-10-CM | POA: Insufficient documentation

## 2021-10-28 DIAGNOSIS — N1832 Chronic kidney disease, stage 3b: Secondary | ICD-10-CM

## 2021-10-28 DIAGNOSIS — G4733 Obstructive sleep apnea (adult) (pediatric): Secondary | ICD-10-CM | POA: Diagnosis not present

## 2021-10-28 DIAGNOSIS — F329 Major depressive disorder, single episode, unspecified: Secondary | ICD-10-CM

## 2021-10-28 DIAGNOSIS — J45909 Unspecified asthma, uncomplicated: Secondary | ICD-10-CM | POA: Insufficient documentation

## 2021-10-28 DIAGNOSIS — Z7984 Long term (current) use of oral hypoglycemic drugs: Secondary | ICD-10-CM | POA: Insufficient documentation

## 2021-10-28 DIAGNOSIS — Z853 Personal history of malignant neoplasm of breast: Secondary | ICD-10-CM | POA: Insufficient documentation

## 2021-10-28 DIAGNOSIS — Z9221 Personal history of antineoplastic chemotherapy: Secondary | ICD-10-CM | POA: Diagnosis not present

## 2021-10-28 DIAGNOSIS — K219 Gastro-esophageal reflux disease without esophagitis: Secondary | ICD-10-CM | POA: Diagnosis not present

## 2021-10-28 NOTE — Progress Notes (Signed)
Patient ID: Veronica Graham, female    DOB: [birth year], 76 y.o.   MRN: [id number]  HPI  Veronica Graham is a 76 y/o female with a history of asthma, breast cancer, HTN, CKD, arthritis, depression, sleep apnea and chronic heart failure.   Echo report from 05/28/20 reviewed and showed an EF of 55-50% along with mild LVH/ LAE & moderate MR.   Admitted 05/02/21 due to complaints of fever, urinary frequency and diarrhea times several days. Found to be in severe sepsis felt to be secondary to a urinary tract infection. Given IVF and antibiotics. BNP elevated so diuresis given. PT evaluation done. Discharged after 3 days.   She presents today for a follow-up visit with a chief complaint of shortness of breath and she describes this as chronic in nature. She is also having severe GERD that started 4 weeks ago. She has associated fatigue, dizziness, abdominal pain, and lower extremity edema along with this.  She denies any difficulty sleeping, palpitations, chest pain/pressure, nor wheezing or cough.   Trying to follow a low-sodium diet. Not adding salt to foods. Scales are broken so she is not weighing daily. Currently in the doughnut hole  Past Medical History:  Diagnosis Date   Arthritis    Asthma    Breast cancer ([place]) 2003   right breast   Breast cancer ([place])    CHF (congestive heart failure) ([place])    Chronic kidney disease    Depression    Environmental and seasonal allergies    HOH (hard of hearing)    Hypertension    Lower extremity edema    Obesity    Personal history of chemotherapy 2003   Rt. breast   Sinus headache    Sleep apnea    does not use CPAP   Past Surgical History:  Procedure Laterality Date   APPENDECTOMY     BREAST EXCISIONAL BIOPSY Right 2003   positve   BTL     CATARACT EXTRACTION W/PHACO Right 03/31/2017   Procedure: CATARACT EXTRACTION PHACO AND INTRAOCULAR LENS PLACEMENT ([place]);  Surgeon: Birder Robson, MD;  Location: [facility] ORS;  Service: Ophthalmology;   Laterality: Right;  [place] 00:38.2 AP% 18.2 CDE 6.95 Fluid Pcak lot # [id number]   CATARACT EXTRACTION W/PHACO Left 04/22/2017   Procedure: CATARACT EXTRACTION PHACO AND INTRAOCULAR LENS PLACEMENT (IOC);  Surgeon: Birder Robson, MD;  Location: [facility] ORS;  Service: Ophthalmology;  Laterality: Left;  [place] 00:39.0 AP% 12.0 CDE 4.68 Fluid Pack Lot # [id number]   CHOLECYSTECTOMY     GASTRIC BYPASS     JOINT REPLACEMENT     MASTECTOMY, PARTIAL     REPLACEMENT TOTAL KNEE BILATERAL     Family History  Problem Relation Age of Onset   Heart failure Mother    COPD Father    CAD Brother    Social History   Tobacco Use   Smoking status: Never   Smokeless tobacco: Never  Substance Use Topics   Alcohol use: No   Allergies  Allergen Reactions   Other Other (See Comments)    DO NOT USE BLOOD PRESSURE CUFF OR NEEDLES (IVs) in RIGHT ARM   Prior to Admission medications   Medication Sig Start Date End Date Taking? Authorizing Provider  albuterol (PROVENTIL HFA;VENTOLIN HFA) 108 (90 Base) MCG/ACT inhaler Inhale 2 puffs into the lungs every 4 (four) hours as needed for wheezing or shortness of breath.   Yes [provider]  ALPRAZolam Duanne Moron) 0.5 MG tablet Take 0.5 mg by mouth  2 (two) times daily as needed for anxiety (pt takes at bedtime each night).   Yes [provider]  calcitRIOL (ROCALTROL) 0.25 MCG capsule Take 0.25 mcg by mouth daily. 04/22/21  Yes [provider]  carvedilol (COREG) 3.125 MG tablet Take 3.125 mg by mouth 2 (two) times daily. 03/21/21  Yes [provider]  Chlorphen-Pseudoephed-APAP (TYLENOL ALLERGY COMPLETE PO) Take 2 tablets by mouth at bedtime.   Yes [provider]  citalopram (CELEXA) 20 MG tablet Take 10 mg by mouth daily.   Yes [provider]  FARXIGA 10 MG TABS tablet Take 10 mg by mouth every morning. 04/23/21  Yes [provider]  KLOR-CON M20 20 MEQ tablet TAKE 1 TABLET BY MOUTH EVERY DAY 09/06/21  Yes  Hackney, Tina A, FNP  montelukast (SINGULAIR) 10 MG tablet Take 10 mg by mouth at bedtime.   Yes [provider]  omeprazole (PRILOSEC) 20 MG capsule Take 20 mg by mouth 2 (two) times daily. 02/18/21  Yes [provider]  torsemide (DEMADEX) 20 MG tablet Take 10 mg by mouth daily. 03/21/21  Yes [provider]  valsartan (DIOVAN) 160 MG tablet Take 160 mg by mouth daily. 01/03/20  Yes [provider]  venlafaxine XR (EFFEXOR-XR) 150 MG 24 hr capsule Take 150 mg by mouth daily. 02/20/21  Yes [provider]  NON FORMULARY Place 1 drop into the right eye 2 (two) times daily. Imprimis Eye Drop Patient not taking: Reported on 10/28/2021    [provider]  losartan-hydrochlorothiazide (HYZAAR) 100-25 MG tablet Take 1 tablet by mouth daily.  02/07/20  [provider]   Review of Systems  Constitutional:  Positive for fatigue (easily). Negative for appetite change.  HENT:  Positive for hearing loss. Negative for congestion and sore throat.   Eyes: Negative.   Respiratory:  Positive for shortness of breath. Negative for cough and wheezing.   Cardiovascular:  Positive for leg swelling. Negative for chest pain and palpitations.  Gastrointestinal:  Negative for abdominal distention and abdominal pain.  Endocrine: Negative.   Genitourinary: Negative.   Musculoskeletal:  Negative for back pain and neck pain.  Skin: Negative.   Allergic/Immunologic: Negative.   Neurological:  Negative for dizziness and light-headedness.  Hematological:  Negative for adenopathy. Does not bruise/bleed easily.  Psychiatric/Behavioral:  Negative for dysphoric mood and sleep disturbance. The patient is not nervous/anxious.    Vitals:   10/28/21 1357  BP: (!) 148/79  Pulse: 69  Resp: 18  SpO2: 99%    Wt Readings from Last 3 Encounters:  10/28/21 242 lb (109.8 kg)  06/26/21 249 lb 6 oz (113.1 kg)  05/14/21 242 lb (109.8 kg)    Lab Results  Component Value  Date   CREATININE 1.51 (H) 06/26/2021   CREATININE 1.34 (H) 05/14/2021   CREATININE 1.84 (H) 05/07/2021    Physical Exam Vitals and nursing note reviewed.  Constitutional:      Appearance: Normal appearance.  HENT:     Head: Normocephalic and atraumatic.     Right Ear: Decreased hearing noted.     Left Ear: Decreased hearing noted.  Cardiovascular:     Rate and Rhythm: Normal rate and regular rhythm.  Pulmonary:     Effort: Pulmonary effort is normal. No respiratory distress.     Breath sounds: No wheezing or rales.  Abdominal:     General: There is no distension.     Palpations: Abdomen is soft.     Tenderness: There is  no abdominal tenderness.  Musculoskeletal:        General: No tenderness.     Cervical back: Normal range of motion and neck supple.     Right lower leg: Edema (trace pitting) present.     Left lower leg: Edema (trace pitting) present.  Skin:    General: Skin is warm and dry.  Neurological:     General: No focal deficit present.     Mental Status: She is oriented to person, place, and time.  Psychiatric:        Mood and Affect: Mood normal.        Behavior: Behavior normal.        Thought Content: Thought content normal.   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 (LVH/LAE)- - NYHA class III - euvolemic today - not weighing daily as her scales broke but she's planning to get a new set soon; reminded to call for an overnight weight gain of > 2 pounds or weekly gain > 5 pounds - weight down 7 pounds from last visit here 06/26/21 - not adding salt and trying to be mindful of sodium content of foods - on GDMT of farxiga - saw cardiology Dema Severin) 08/06/21 - BNP 05/06/21 was 317.1 - Will switch to Entresto in January due to insurance donut hole  2: HTN with CKD- - BP mildly elevated (148/79) - saw nephrology Holley Raring) 08/26/21 - BMP 08/21/21 reviewed and showed sodium 144, potassium 4.5, creatinine 1.53 and  GFR 35  3: Depression- - saw PCP Baldemar Lenis) 08/08/21 - stable at this time  4: Sleep apnea- - sleeping well but does not wear CPAP - saw pulmonology Raul Del) 10/03/21   Medication list reviewed.   Return in 4 months, sooner if needed.

## 2021-10-28 NOTE — Patient Instructions (Signed)
Continue weighing daily and call for an overnight weight gain of 3 pounds or more or a weekly weight gain of more than 5 pounds.  °

## 2021-12-17 ENCOUNTER — Encounter: Payer: Self-pay | Admitting: Internal Medicine

## 2021-12-18 ENCOUNTER — Ambulatory Visit: Payer: Medicare Other | Admitting: Certified Registered"

## 2021-12-18 ENCOUNTER — Encounter
Admission: RE | Disposition: A | Discharge status: Home / Self Care | Payer: Self-pay | Source: Ambulatory Visit | Attending: Internal Medicine

## 2021-12-18 ENCOUNTER — Encounter: Payer: Self-pay | Admitting: Internal Medicine

## 2021-12-18 ENCOUNTER — Ambulatory Visit
Admission: RE | Admit: 2021-12-18 | Discharge: 2021-12-18 | Disposition: A | Discharge status: Home / Self Care | Payer: Medicare Other | Source: Ambulatory Visit | Attending: Internal Medicine | Admitting: Internal Medicine

## 2021-12-18 DIAGNOSIS — R1013 Epigastric pain: Secondary | ICD-10-CM | POA: Diagnosis present

## 2021-12-18 DIAGNOSIS — I130 Hypertensive heart and chronic kidney disease with heart failure and stage 1 through stage 4 chronic kidney disease, or unspecified chronic kidney disease: Secondary | ICD-10-CM | POA: Diagnosis not present

## 2021-12-18 DIAGNOSIS — K591 Functional diarrhea: Secondary | ICD-10-CM | POA: Insufficient documentation

## 2021-12-18 DIAGNOSIS — K5730 Diverticulosis of large intestine without perforation or abscess without bleeding: Secondary | ICD-10-CM | POA: Insufficient documentation

## 2021-12-18 DIAGNOSIS — K640 First degree hemorrhoids: Secondary | ICD-10-CM | POA: Diagnosis not present

## 2021-12-18 DIAGNOSIS — J45909 Unspecified asthma, uncomplicated: Secondary | ICD-10-CM | POA: Diagnosis not present

## 2021-12-18 DIAGNOSIS — G4730 Sleep apnea, unspecified: Secondary | ICD-10-CM | POA: Diagnosis not present

## 2021-12-18 DIAGNOSIS — R159 Full incontinence of feces: Secondary | ICD-10-CM | POA: Diagnosis not present

## 2021-12-18 DIAGNOSIS — F32A Depression, unspecified: Secondary | ICD-10-CM | POA: Diagnosis not present

## 2021-12-18 DIAGNOSIS — Z9884 Bariatric surgery status: Secondary | ICD-10-CM | POA: Insufficient documentation

## 2021-12-18 DIAGNOSIS — G4733 Obstructive sleep apnea (adult) (pediatric): Secondary | ICD-10-CM | POA: Diagnosis not present

## 2021-12-18 DIAGNOSIS — Q438 Other specified congenital malformations of intestine: Secondary | ICD-10-CM | POA: Diagnosis not present

## 2021-12-18 DIAGNOSIS — I509 Heart failure, unspecified: Secondary | ICD-10-CM | POA: Diagnosis not present

## 2021-12-18 DIAGNOSIS — F419 Anxiety disorder, unspecified: Secondary | ICD-10-CM | POA: Diagnosis not present

## 2021-12-18 DIAGNOSIS — N189 Chronic kidney disease, unspecified: Secondary | ICD-10-CM | POA: Diagnosis not present

## 2021-12-18 HISTORY — PX: COLONOSCOPY: SHX5424

## 2021-12-18 HISTORY — PX: ESOPHAGOGASTRODUODENOSCOPY: SHX5428

## 2021-12-18 SURGERY — COLONOSCOPY
Anesthesia: General

## 2021-12-18 MED ORDER — LIDOCAINE HCL (PF) 2 % IJ SOLN
INTRAMUSCULAR | Status: AC
  Filled 2021-12-18: qty 5

## 2021-12-18 MED ORDER — PROPOFOL 500 MG/50ML IV EMUL
INTRAVENOUS | Status: DC | PRN
  Administered 2021-12-18: 150 ug/kg/min via INTRAVENOUS

## 2021-12-18 MED ORDER — PROPOFOL 10 MG/ML IV BOLUS
INTRAVENOUS | Status: AC
  Filled 2021-12-18: qty 20

## 2021-12-18 MED ORDER — SODIUM CHLORIDE 0.9 % IV SOLN
INTRAVENOUS | Status: DC
  Administered 2021-12-18: 20 mL/h via INTRAVENOUS

## 2021-12-18 MED ORDER — LIDOCAINE HCL (CARDIAC) PF 100 MG/5ML IV SOSY
PREFILLED_SYRINGE | INTRAVENOUS | Status: DC | PRN
  Administered 2021-12-18 (×2): 50 mg via INTRAVENOUS

## 2021-12-18 MED ORDER — DEXMEDETOMIDINE HCL IN NACL 80 MCG/20ML IV SOLN
INTRAVENOUS | Status: AC
  Filled 2021-12-18: qty 20

## 2021-12-18 MED ORDER — DEXMEDETOMIDINE HCL IN NACL 200 MCG/50ML IV SOLN
INTRAVENOUS | Status: DC | PRN
  Administered 2021-12-18: 8 ug via INTRAVENOUS

## 2021-12-18 MED ORDER — PROPOFOL 10 MG/ML IV BOLUS
INTRAVENOUS | Status: DC | PRN
  Administered 2021-12-18 (×2): 50 mg via INTRAVENOUS

## 2021-12-18 NOTE — Interval H&P Note (Signed)
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  The various methods of treatment have been discussed with the patient and family. After consideration of risks, benefits and other options for treatment, the patient has consented to  Procedure(s): COLONOSCOPY (N/A) ESOPHAGOGASTRODUODENOSCOPY (EGD) (N/A) as a surgical intervention.  The patient's history has been reviewed, patient examined, no change in status, stable for surgery.  I have reviewed the patient's chart and labs.  Questions were answered to the patient's satisfaction.     [place], [place]

## 2021-12-18 NOTE — Op Note (Signed)
[facility] Gastroenterology Patient Name: Veronica Graham Procedure Date: 12/18/2021 10:53 AM MRN: [id number] Account #: [id number] Date of Birth: [birth year] Admit Type: Outpatient Age: 76 Room: [facility] ENDO ROOM 2 Gender: Female Note Status: Finalized Instrument Name: Upper Endoscope [id number] Procedure:             Upper GI endoscopy Indications:           Epigastric abdominal pain Providers:             Lorie Apley K. Alice Reichert MD, MD Referring MD:          Caprice Renshaw MD (Referring MD) Medicines:             Propofol per Anesthesia Complications:         No immediate complications. Estimated blood loss: None. Procedure:             Pre-Anesthesia Assessment:                        - The risks and benefits of the procedure and the                         sedation options and risks were discussed with the                         patient. All questions were answered and informed                         consent was obtained.                        - Patient identification and proposed procedure were                         verified prior to the procedure by the nurse. The                         procedure was verified in the procedure room.                        After obtaining informed consent, the endoscope was                         passed under direct vision. Throughout the procedure,                         the patient's blood pressure, pulse, and oxygen                         saturations were monitored continuously. The Endoscope                         was introduced through the mouth, and advanced to the                         efferent jejunal loop. The upper GI endoscopy was                         accomplished without difficulty. The patient tolerated  the procedure well. Findings:      The esophagus was normal.      Evidence of a Roux-en-Y gastrojejunostomy was found. The gastrojejunal       anastomosis was characterized by healthy  appearing mucosa. This was       traversed. The pouch-to-jejunum limb was characterized by healthy       appearing mucosa. The duodenum-to-jejunum limb was not examined as it       could not be traversed. The excluded stomach was not examined as it       could not be traversed.      The examined jejunum was normal. Impression:            - Normal esophagus.                        - Roux-en-Y gastrojejunostomy with gastrojejunal                         anastomosis characterized by healthy appearing mucosa.                        - Normal examined jejunum.                        - No specimens collected. Recommendation:        - Proceed with colonoscopy Procedure Code(s):     --- Professional ---                        [phone], Esophagogastroduodenoscopy, flexible,                         transoral; diagnostic, including collection of                         specimen(s) by brushing or washing, when performed                         (separate procedure) Diagnosis Code(s):     --- Professional ---                        R10.13, Epigastric pain                        Z98.0, Intestinal bypass and anastomosis status CPT copyright 2022 American Medical Association. All rights reserved. The codes documented in this report are preliminary and upon coder review may  be revised to meet current compliance requirements. Efrain Sella MD, MD 12/18/2021 11:15:26 AM This report has been signed electronically. Number of Addenda: 0 Note Initiated On: 12/18/2021 10:53 AM Estimated Blood Loss:  Estimated blood loss: none. Estimated blood loss: none.      [facility]

## 2021-12-18 NOTE — H&P (Signed)
Outpatient short stay form Pre-procedure 12/18/2021 10:57 AM Veronica Graham K. Alice Reichert, M.D.  Primary Physician: Derinda Late, M.D.  Reason for visit:  Epigastric pain, s/p RnY GBP, Fecal urgency, OSA, CHF, Functional diarrhea   History of present illness:   Ms. Veronica Graham is a pleasant 76 year old female presenting today for gastroenterology consultation requested by Dr. Baldemar Lenis for change in bowel habits, fecal incontinence. Patient states for the past the past year she has had issues with altered bowel habits with increased looseness of the stool, "pudding consistency" as well as some solid stools that have a sideways indentation suggesting possible mass effect in the colon. Patient is concerned about this. Patient has undergone several natural childbirth with need for episiotomy. She has had increased frequency of involuntary flatulence as well as involuntary passage of stool with flatulence. She has had these in the toilet when she is urinating and also rarely in her undergarment. Patient denies any rectal bleeding or weight loss. She is status post Roux-en-Y gastric bypass circa 2006. She lost approximately 254 pounds but admittedly has gained back about 75 pounds of that lost over the last 3 to 5 years. She admits to taking antibiotics in the last 3 to 6 months for urinary tract infection. She was admitted to the hospital in March 2023 for severe UTI and given IV and oral antibiotics. Patient denies any family history of colon cancer or any first-degree relatives although she reports 2 second-degree relatives (second cousin and grandparent) with history of colon cancer. Patient underwent an EGD and colonoscopy at [facility] in 2016 which were "both normal". In regards to upper GI symptoms, patient has postprandial epigastric pain at least once or twice weekly which she considers "sharp" and nonradiating. 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 She does admit to taking 2 Advil weekly for joint aches. She denies any significant dysphagia except at taking a potassium supplement. She has had increased heartburn recently over the past few months requiring her to resume her daily omeprazole therapy. She reports significant benefit with the therapy. Patient does have history of chronic kidney disease and was advised not to take NSAIDs by her physician.    Current Facility-Administered Medications:    0.9 %  sodium chloride infusion, , Intravenous, Continuous, [place], Benay Pike, MD, Last Rate: 20 mL/hr at 12/18/21 1031, Continued from Pre-op at 12/18/21 1031  Medications Prior to Admission  Medication Sig Dispense Refill Last Dose   albuterol (PROVENTIL HFA;VENTOLIN HFA) 108 (90 Base) MCG/ACT inhaler Inhale 2 puffs into the lungs every 4 (four) hours as needed for wheezing or shortness of breath.   12/17/2021   ALPRAZolam (XANAX) 0.5 MG tablet Take 0.5 mg by mouth 2 (two) times daily as needed for anxiety (pt takes at bedtime each night).   12/17/2021   carvedilol (COREG) 3.125 MG tablet Take 3.125 mg by mouth 2 (two) times daily.   12/17/2021   Chlorphen-Pseudoephed-APAP (TYLENOL ALLERGY COMPLETE PO) Take 2 tablets by mouth at bedtime.   12/17/2021   calcitRIOL (ROCALTROL) 0.25 MCG capsule Take 0.25 mcg by mouth daily. (Patient not taking: Reported on 12/18/2021)   Completed Course   citalopram (CELEXA) 20 MG tablet Take 10 mg by mouth daily. (Patient not taking: Reported on 12/18/2021)   Completed Course   FARXIGA 10 MG TABS tablet Take 10 mg by mouth every morning.      KLOR-CON M20 20  MEQ tablet TAKE 1 TABLET BY MOUTH EVERY DAY 90 tablet 3    montelukast (SINGULAIR) 10 MG tablet Take 10 mg by mouth at bedtime.      NON FORMULARY Place 1 drop into the right eye 2 (two) times daily. Imprimis Eye Drop (Patient not taking: Reported on 10/28/2021)      omeprazole (PRILOSEC) 20 MG capsule  Take 20 mg by mouth 2 (two) times daily.      torsemide (DEMADEX) 20 MG tablet Take 10 mg by mouth daily.      valsartan (DIOVAN) 160 MG tablet Take 160 mg by mouth daily. (Patient not taking: Reported on 12/18/2021)   Completed Course   venlafaxine XR (EFFEXOR-XR) 150 MG 24 hr capsule Take 150 mg by mouth daily. (Patient not taking: Reported on 12/18/2021)   Completed Course     Allergies  Allergen Reactions   Other Other (See Comments)    DO NOT USE BLOOD PRESSURE CUFF OR NEEDLES (IVs) in RIGHT ARM     Past Medical History:  Diagnosis Date   Arthritis    Asthma    Breast cancer ([place]) 2003   right breast   Breast cancer ([place])    CHF (congestive heart failure) ([facility])    Chronic kidney disease    Depression    Environmental and seasonal allergies    HOH (hard of hearing)    Hypertension    Lower extremity edema    Obesity    Personal history of chemotherapy 2003   Rt. breast   Sinus headache    Sleep apnea    does not use CPAP    Review of systems:  Otherwise negative.    Physical Exam  Gen: Alert, oriented. Appears stated age.  HEENT: [place]/AT. PERRLA. Lungs: CTA, no wheezes. CV: RR nl S1, S2. Abd: soft, benign, no masses. BS+ Ext: No edema. Pulses 2+    Planned procedures: Proceed with EGD and  colonoscopy. The patient understands the nature of the planned procedure, indications, risks, alternatives and potential complications including but not limited to bleeding, infection, perforation, damage to internal organs and possible oversedation/side effects from anesthesia. The patient agrees and gives consent to proceed.  Please refer to procedure notes for findings, recommendations and patient disposition/instructions.     Kelsay Haggard K. Alice Reichert, M.D. Gastroenterology 12/18/2021  10:57 AM

## 2021-12-18 NOTE — Op Note (Signed)
[facility] Gastroenterology Patient Name: Veronica Graham Procedure Date: 12/18/2021 10:52 AM MRN: [id number] Account #: [id number] Date of Birth: 12-30-1945 Admit Type: Outpatient Age: 76 Room: [facility] ENDO ROOM 2 Gender: Female Note Status: Finalized Instrument Name: Park Meo [id number] Procedure:             Colonoscopy Indications:           Functional diarrhea Providers:             Benay Pike. Alice Reichert MD, MD Referring MD:          Caprice Renshaw MD (Referring MD) Medicines:             Propofol per Anesthesia Complications:         No immediate complications. Estimated blood loss: None. Procedure:             Pre-Anesthesia Assessment:                        - The risks and benefits of the procedure and the                         sedation options and risks were discussed with the                         patient. All questions were answered and informed                         consent was obtained.                        - Patient identification and proposed procedure were                         verified prior to the procedure by the nurse. The                         procedure was verified in the procedure room.                        - ASA Grade Assessment: III - A patient with severe                         systemic disease.                        - After reviewing the risks and benefits, the patient                         was deemed in satisfactory condition to undergo the                         procedure.                        After obtaining informed consent, the colonoscope was                         passed under direct vision. Throughout the procedure,  the patient's blood pressure, pulse, and oxygen                         saturations were monitored continuously. The                         Colonoscope was introduced through the anus and                         advanced to the the cecum, identified by appendiceal                          orifice and ileocecal valve. The colonoscopy was                         technically difficult and complex due to significant                         looping and a tortuous colon. Successful completion of                         the procedure was aided by applying abdominal                         pressure. The patient tolerated the procedure well.                         The quality of the bowel preparation was adequate. The                         ileocecal valve, appendiceal orifice, and rectum were                         photographed. Findings:      The perianal and digital rectal examinations were normal. Pertinent       negatives include normal sphincter tone and no palpable rectal lesions.      Non-bleeding internal hemorrhoids were found during retroflexion. The       hemorrhoids were Grade I (internal hemorrhoids that do not prolapse).      Many small-mouthed diverticula were found in the sigmoid colon and       descending colon. There was no evidence of diverticular bleeding.      Normal mucosa was found in the entire colon. Biopsies for histology were       taken with a cold forceps from the random colon for evaluation of       microscopic colitis. Impression:            - Non-bleeding internal hemorrhoids.                        - Mild diverticulosis in the sigmoid colon and in the                         descending colon. There was no evidence of                         diverticular bleeding.                        -  Normal mucosa in the entire examined colon. Biopsied. Recommendation:        - Patient has a contact number available for                         emergencies. The signs and symptoms of potential                         delayed complications were discussed with the patient.                         Return to normal activities tomorrow. Written                         discharge instructions were provided to the patient.                        - Resume  previous diet.                        - Post gastric bypass diet indefinitely.                        - Continue present medications.                        - No further screening colonoscopies or colon cancer                         screening measures (stool hemoccult, cologuard, barium                         enema, etc.)                        - Return to my office in 3 months.                        - Telephone GI office to schedule appointment. Procedure Code(s):     --- Professional ---                        [phone], Colonoscopy, flexible; with biopsy, single or                         multiple Diagnosis Code(s):     --- Professional ---                        K57.30, Diverticulosis of large intestine without                         perforation or abscess without bleeding                        K59.1, Functional diarrhea                        K64.0, First degree hemorrhoids CPT copyright 2022 American Medical Association. All rights reserved. The codes documented in this report are preliminary and upon coder review may  be revised to meet current compliance requirements. Efrain Sella MD, MD 12/18/2021 11:44:05  AM This report has been signed electronically. Number of Addenda: 0 Note Initiated On: 12/18/2021 10:52 AM Scope Withdrawal Time: 0 hours 6 minutes 48 seconds  Total Procedure Duration: 0 hours 15 minutes 35 seconds  Estimated Blood Loss:  Estimated blood loss: none.      [facility]

## 2021-12-18 NOTE — Anesthesia Preprocedure Evaluation (Signed)
Anesthesia Evaluation  Patient identified by MRN, date of birth, ID band Patient awake    Reviewed: Allergy & Precautions, H&P , NPO status , reviewed documented beta blocker date and time   History of Anesthesia Complications Negative for: history of anesthetic complications  Airway Mallampati: III  TM Distance: >3 FB     Dental  (+) Upper Dentures   Pulmonary neg shortness of breath, asthma , sleep apnea , neg recent URI    + decreased breath sounds      Cardiovascular hypertension, (-) angina +CHF  (-) Past MI and (-) Cardiac Stents Normal cardiovascular exam(-) dysrhythmias (-) Valvular Problems/Murmurs     Neuro/Psych  Headaches, neg Seizures PSYCHIATRIC DISORDERS Anxiety Depression       GI/Hepatic negative GI ROS, Neg liver ROS,,,  Endo/Other  negative endocrine ROS    Renal/GU CRFRenal disease     Musculoskeletal   Abdominal   Peds  Hematology   Anesthesia Other Findings   Reproductive/Obstetrics                             Anesthesia Physical Anesthesia Plan  ASA: 4  Anesthesia Plan: General   Post-op Pain Management:    Induction: Intravenous  PONV Risk Score and Plan: 2 and Propofol infusion and TIVA  Airway Management Planned: Natural Airway and Nasal Cannula  Additional Equipment:   Intra-op Plan:   Post-operative Plan:   Informed Consent: I have reviewed the patients History and Physical, chart, labs and discussed the procedure including the risks, benefits and alternatives for the proposed anesthesia with the patient or authorized representative who has indicated his/her understanding and acceptance.     Dental Advisory Given  Plan Discussed with: CRNA  Anesthesia Plan Comments:         Anesthesia Quick Evaluation

## 2021-12-18 NOTE — Transfer of Care (Signed)
Immediate Anesthesia Transfer of Care Note  Patient: Veronica Graham  Procedure(s) Performed: COLONOSCOPY ESOPHAGOGASTRODUODENOSCOPY (EGD)  Patient Location: PACU  Anesthesia Type:MAC  Level of Consciousness: awake, alert , oriented, and patient cooperative  Airway & Oxygen Therapy: Patient Spontanous Breathing  Post-op Assessment: Report given to RN and Post -op Vital signs reviewed and stable  Post vital signs: Reviewed and stable  Last Vitals:  Vitals Value Taken Time  BP    Temp    Pulse    Resp 14 12/18/21 1138  SpO2    Vitals shown include unvalidated device data.  Last Pain:  Vitals:   12/18/21 0944  TempSrc: Temporal  PainSc: 0-No pain         Complications: No notable events documented.

## 2021-12-19 ENCOUNTER — Encounter: Payer: Self-pay | Admitting: Internal Medicine

## 2021-12-19 LAB — SURGICAL PATHOLOGY

## 2021-12-30 NOTE — Anesthesia Postprocedure Evaluation (Signed)
Anesthesia Post Note  Patient: Veronica Graham  Procedure(s) Performed: COLONOSCOPY ESOPHAGOGASTRODUODENOSCOPY (EGD)  Patient location during evaluation: Endoscopy Anesthesia Type: General Level of consciousness: awake and alert Pain management: pain level controlled Vital Signs Assessment: post-procedure vital signs reviewed and stable Respiratory status: spontaneous breathing, nonlabored ventilation, respiratory function stable and patient connected to nasal cannula oxygen Cardiovascular status: blood pressure returned to baseline and stable Postop Assessment: no apparent nausea or vomiting Anesthetic complications: no   No notable events documented.   Last Vitals:  Vitals:   12/18/21 1148 12/18/21 1158  BP: 98/79 (!) 131/96  Pulse: 61   Resp: 19 (!) 21  Temp:    SpO2: 96% 100%    Last Pain:  Vitals:   12/18/21 1158  TempSrc:   PainSc: 0-No pain                 Martha Clan

## 2022-02-24 NOTE — Progress Notes (Deleted)
Patient ID: ESTIE HEGDE, female    DOB: 1946/01/29, 77 y.o.   MRN: [id number]  HPI  Ms Perrigo is a 77 y/o female with a history of asthma, breast cancer, HTN, CKD, arthritis, depression, sleep apnea and chronic heart failure.   Echo report from 05/28/20 reviewed and showed an EF of 55-50% along with mild LVH/ LAE & moderate MR.   Has not been admitted or been in the ED in the last 6 months.   She presents today for a follow-up visit with a chief complaint of   Past Medical History:  Diagnosis Date   Arthritis    Asthma    Breast cancer ([place]) 2003   right breast   Breast cancer ([place])    CHF (congestive heart failure) ([place])    Chronic kidney disease    Depression    Environmental and seasonal allergies    HOH (hard of hearing)    Hypertension    Lower extremity edema    Obesity    Personal history of chemotherapy 2003   Rt. breast   Sinus headache    Sleep apnea    does not use CPAP   Past Surgical History:  Procedure Laterality Date   APPENDECTOMY     BREAST EXCISIONAL BIOPSY Right 2003   positve   BTL     CATARACT EXTRACTION W/PHACO Right 03/31/2017   Procedure: CATARACT EXTRACTION PHACO AND INTRAOCULAR LENS PLACEMENT (IOC);  Surgeon: Birder Robson, MD;  Location: [facility] ORS;  Service: Ophthalmology;  Laterality: Right;  [place] 00:38.2 AP% 18.2 CDE 6.95 Fluid Pc[place] lot # [id number]   CATARACT EXTRACTION W/PHACO Left 04/22/2017   Procedure: CATARACT EXTRACTION PHACO AND INTRAOCULAR LENS PLACEMENT (IOC);  Surgeon: Birder Robson, MD;  Location: [facility] ORS;  Service: Ophthalmology;  Laterality: Left;  [place] 00:39.0 AP% 12.0 CDE 4.68 Fluid Pack Lot # [id number]   CHOLECYSTECTOMY     COLONOSCOPY N/A 12/18/2021   Procedure: COLONOSCOPY;  Surgeon: Toledo, Benay Pike, MD;  Location: [facility] ENDOSCOPY;  Service: Gastroenterology;  Laterality: N/A;   ESOPHAGOGASTRODUODENOSCOPY N/A 12/18/2021   Procedure: ESOPHAGOGASTRODUODENOSCOPY (EGD);  Surgeon: Toledo, Benay Pike, MD;  Location: [facility]  ENDOSCOPY;  Service: Gastroenterology;  Laterality: N/A;   EYE SURGERY     GASTRIC BYPASS     JOINT REPLACEMENT     MASTECTOMY, PARTIAL     REPLACEMENT TOTAL KNEE BILATERAL     TUBAL LIGATION     Family History  Problem Relation Age of Onset   Heart failure Mother    COPD Father    CAD Brother    Social History   Tobacco Use   Smoking status: Never   Smokeless tobacco: Never  Substance Use Topics   Alcohol use: No   Allergies  Allergen Reactions   Other Other (See Comments)    DO NOT USE BLOOD PRESSURE CUFF OR NEEDLES (IVs) in RIGHT ARM    Review of Systems  Constitutional:  Positive for fatigue (easily). Negative for appetite change.  HENT:  Positive for hearing loss. Negative for congestion and sore throat.   Eyes: Negative.   Respiratory:  Positive for shortness of breath. Negative for cough and wheezing.   Cardiovascular:  Positive for leg swelling. Negative for chest pain and palpitations.  Gastrointestinal:  Negative for abdominal distention and abdominal pain.  Endocrine: Negative.   Genitourinary: Negative.   Musculoskeletal:  Negative for back pain and neck pain.  Skin: Negative.   Allergic/Immunologic: Negative.   Neurological:  Negative for dizziness and  light-headedness.  Hematological:  Negative for adenopathy. Does not bruise/bleed easily.  Psychiatric/Behavioral:  Negative for dysphoric mood and sleep disturbance. The patient is not nervous/anxious.       Physical Exam Vitals and nursing note reviewed.  Constitutional:      Appearance: Normal appearance.  HENT:     Head: Normocephalic and atraumatic.     Right Ear: Decreased hearing noted.     Left Ear: Decreased hearing noted.  Cardiovascular:     Rate and Rhythm: Normal rate and regular rhythm.  Pulmonary:     Effort: Pulmonary effort is normal. No respiratory distress.     Breath sounds: No wheezing or rales.  Abdominal:     General: There is no distension.     Palpations: Abdomen is  soft.     Tenderness: There is no abdominal tenderness.  Musculoskeletal:        General: No tenderness.     Cervical back: Normal range of motion and neck supple.     Right lower leg: Edema (trace pitting) present.     Left lower leg: Edema (trace pitting) present.  Skin:    General: Skin is warm and dry.  Neurological:     General: No focal deficit present.     Mental Status: She is oriented to person, place, and time.  Psychiatric:        Mood and Affect: Mood normal.        Behavior: Behavior normal.        Thought Content: Thought content normal.    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 (LVH/LAE)- - NYHA class III - euvolemic today - not weighing daily as her scales broke but she's planning to get a new set soon; reminded to call for an overnight weight gain of > 2 pounds or weekly gain > 5 pounds - weight 242 pounds from last visit here 4 months ago - not adding salt and trying to be mindful of sodium content of foods - on GDMT of farxiga - saw cardiology Dema Severin) 12/04/21 - BNP 05/06/21 was 317.1 - Will switch to Entresto in January due to insurance donut hole  2: HTN with CKD- - BP  - saw nephrology Holley Raring) 08/26/21 - BMP 02/06/22 reviewed and showed sodium 144, potassium 3.7, creatinine 1.3 and GFR 43  3: Depression- - saw PCP Baldemar Lenis) 08/08/21 - stable at this time  4: Sleep apnea- - sleeping well but does not wear CPAP - saw pulmonology Raul Del) 10/03/21   Medication list reviewed.

## 2022-02-25 ENCOUNTER — Ambulatory Visit: Payer: PRIVATE HEALTH INSURANCE | Admitting: Family

## 2022-02-25 ENCOUNTER — Other Ambulatory Visit (HOSPITAL_COMMUNITY): Payer: Self-pay

## 2022-02-25 ENCOUNTER — Telehealth: Payer: Self-pay | Admitting: Family

## 2022-02-25 NOTE — Telephone Encounter (Signed)
Patient did not show for her [facility] appointment on 02/25/22. Will attempt to reschedule.

## 2022-06-14 ENCOUNTER — Other Ambulatory Visit: Payer: Self-pay

## 2022-06-14 ENCOUNTER — Emergency Department: Payer: Medicare Other

## 2022-06-14 ENCOUNTER — Emergency Department
Admission: EM | Admit: 2022-06-14 | Discharge: 2022-06-14 | Disposition: A | Discharge status: Home / Self Care | Payer: Medicare Other | Attending: Emergency Medicine | Admitting: Emergency Medicine

## 2022-06-14 DIAGNOSIS — R609 Edema, unspecified: Secondary | ICD-10-CM | POA: Diagnosis not present

## 2022-06-14 DIAGNOSIS — S0081XA Abrasion of other part of head, initial encounter: Secondary | ICD-10-CM | POA: Insufficient documentation

## 2022-06-14 DIAGNOSIS — M79672 Pain in left foot: Secondary | ICD-10-CM | POA: Diagnosis not present

## 2022-06-14 DIAGNOSIS — R8271 Bacteriuria: Secondary | ICD-10-CM | POA: Diagnosis not present

## 2022-06-14 DIAGNOSIS — W01198A Fall on same level from slipping, tripping and stumbling with subsequent striking against other object, initial encounter: Secondary | ICD-10-CM | POA: Diagnosis not present

## 2022-06-14 DIAGNOSIS — N189 Chronic kidney disease, unspecified: Secondary | ICD-10-CM | POA: Insufficient documentation

## 2022-06-14 DIAGNOSIS — W19XXXA Unspecified fall, initial encounter: Secondary | ICD-10-CM

## 2022-06-14 DIAGNOSIS — M25562 Pain in left knee: Secondary | ICD-10-CM | POA: Insufficient documentation

## 2022-06-14 DIAGNOSIS — S0101XA Laceration without foreign body of scalp, initial encounter: Secondary | ICD-10-CM | POA: Insufficient documentation

## 2022-06-14 DIAGNOSIS — R42 Dizziness and giddiness: Secondary | ICD-10-CM | POA: Insufficient documentation

## 2022-06-14 DIAGNOSIS — Z23 Encounter for immunization: Secondary | ICD-10-CM | POA: Insufficient documentation

## 2022-06-14 LAB — URINALYSIS, ROUTINE W REFLEX MICROSCOPIC
Bilirubin Urine: NEGATIVE
Glucose, UA: NEGATIVE mg/dL
Hgb urine dipstick: NEGATIVE
Ketones, ur: NEGATIVE mg/dL
Leukocytes,Ua: NEGATIVE
Nitrite: NEGATIVE
Protein, ur: NEGATIVE mg/dL
Specific Gravity, Urine: 1.009 (ref 1.005–1.030)
pH: 5 (ref 5.0–8.0)

## 2022-06-14 LAB — CBC WITH DIFFERENTIAL/PLATELET
Abs Immature Granulocytes: 0.02 10*3/uL (ref 0.00–0.07)
Basophils Absolute: 0.1 10*3/uL (ref 0.0–0.1)
Basophils Relative: 1 %
Eosinophils Absolute: 0.3 10*3/uL (ref 0.0–0.5)
Eosinophils Relative: 5 %
HCT: 38.2 % (ref 36.0–46.0)
Hemoglobin: 12.3 g/dL (ref 12.0–15.0)
Immature Granulocytes: 0 %
Lymphocytes Relative: 32 %
Lymphs Abs: 2 10*3/uL (ref 0.7–4.0)
MCH: 31.5 pg (ref 26.0–34.0)
MCHC: 32.2 g/dL (ref 30.0–36.0)
MCV: 97.7 fL (ref 80.0–100.0)
Monocytes Absolute: 0.4 10*3/uL (ref 0.1–1.0)
Monocytes Relative: 7 %
Neutro Abs: 3.4 10*3/uL (ref 1.7–7.7)
Neutrophils Relative %: 55 %
Platelets: 223 10*3/uL (ref 150–400)
RBC: 3.91 MIL/uL (ref 3.87–5.11)
RDW: 12.8 % (ref 11.5–15.5)
WBC: 6.2 10*3/uL (ref 4.0–10.5)
nRBC: 0 % (ref 0.0–0.2)

## 2022-06-14 LAB — COMPREHENSIVE METABOLIC PANEL
ALT: 21 U/L (ref 0–44)
AST: 28 U/L (ref 15–41)
Albumin: 3.8 g/dL (ref 3.5–5.0)
Alkaline Phosphatase: 168 U/L — ABNORMAL HIGH (ref 38–126)
Anion gap: 7 (ref 5–15)
BUN: 22 mg/dL (ref 8–23)
CO2: 23 mmol/L (ref 22–32)
Calcium: 8.5 mg/dL — ABNORMAL LOW (ref 8.9–10.3)
Chloride: 110 mmol/L (ref 98–111)
Creatinine, Ser: 1.35 mg/dL — ABNORMAL HIGH (ref 0.44–1.00)
GFR, Estimated: 41 mL/min — ABNORMAL LOW (ref >60)
Glucose, Bld: 106 mg/dL — ABNORMAL HIGH (ref 70–99)
Potassium: 3.8 mmol/L (ref 3.5–5.1)
Sodium: 140 mmol/L (ref 135–145)
Total Bilirubin: 0.6 mg/dL (ref 0.3–1.2)
Total Protein: 6.7 g/dL (ref 6.5–8.1)

## 2022-06-14 LAB — TROPONIN I (HIGH SENSITIVITY)
Troponin I (High Sensitivity): 11 ng/L (ref <18)
Troponin I (High Sensitivity): 13 ng/L (ref <18)

## 2022-06-14 MED ORDER — BACITRACIN ZINC 500 UNIT/GM EX OINT
TOPICAL_OINTMENT | CUTANEOUS | Status: AC
  Filled 2022-06-14: qty 1.8

## 2022-06-14 MED ORDER — BACITRACIN ZINC 500 UNIT/GM EX OINT: TOPICAL_OINTMENT | Freq: Two times a day (BID) | CUTANEOUS | Status: DC

## 2022-06-14 MED ORDER — TETANUS-DIPHTH-ACELL PERTUSSIS 5-2.5-18.5 LF-MCG/0.5 IM SUSY
0.5000 mL | PREFILLED_SYRINGE | Freq: Once | INTRAMUSCULAR | Status: AC
  Administered 2022-06-14: 0.5 mL via INTRAMUSCULAR
  Filled 2022-06-14: qty 0.5

## 2022-06-14 MED ORDER — ACETAMINOPHEN 500 MG PO TABS
1000.0000 mg | ORAL_TABLET | Freq: Once | ORAL | Status: AC
  Administered 2022-06-14: 1000 mg via ORAL
  Filled 2022-06-14: qty 2

## 2022-06-14 MED ORDER — LIDOCAINE-EPINEPHRINE (PF) 2 %-1:200000 IJ SOLN
10.0000 mL | Freq: Once | INTRAMUSCULAR | Status: AC
  Administered 2022-06-14: 10 mL
  Filled 2022-06-14: qty 20

## 2022-06-14 NOTE — ED Notes (Signed)
Sutured wound over left eyebrow was dressed with Bacitracin, a non-adherent pad and tegaderm. Patient tolerated procedure well. Bacitracin applied to abrasion on left cheek. Patient tolerated procedure well. Patient declined paper scrub top. Dried blood on arm and face were cleaned off. Patient states she will take care of rest at home.

## 2022-06-14 NOTE — ED Provider Notes (Addendum)
[facility] Provider Note    Event Date/Time   First MD Initiated Contact with Patient 06/14/22 1351     (approximate)   History   Fall   HPI  Veronica Graham is a 77 y.o. female with CKD who comes in with concerns for a fall.  Patient told the triage team that she had a fall when she slipped on wet concrete but when I asked her why she fell she stated that she has felt dizzy and that she then fell to the ground.  She denies blacking out.  Denies any chest pain, shortness of breath but reports episodes of dizziness in the past but unclear reason why.  She reports that otherwise she feels okay except for having some pain on her head from where she hit.  She ALSO reports some pain in her left knee and her left foot.  She reports that she kind of fell onto her left side.  She is unclear of her last tetanus shot.  Denies any blood thinners. Denies any new swelling in legs She denies any dizziness at this time.   Physical Exam   Triage Vital Signs: ED Triage Vitals  Enc Vitals Group     BP 06/14/22 1337 (!) 176/96     Pulse Rate 06/14/22 1334 75     Resp 06/14/22 1334 20     Temp 06/14/22 1334 97.6 F (36.4 C)     Temp Source 06/14/22 1334 Oral     SpO2 06/14/22 1334 98 %     Weight 06/14/22 1337 256 lb (116.1 kg)     Height 06/14/22 1337 5\' 3"  (1.6 m)     Head Circumference --      Peak Flow --      Pain Score 06/14/22 1337 7     Pain Loc --      Pain Edu? --      Excl. in GC? --     Most recent vital signs: Vitals:   06/14/22 1334 06/14/22 1337  BP:  (!) 176/96  Pulse: 75   Resp: 20   Temp: 97.6 F (36.4 C)   SpO2: 98%      General: Awake, no distress.  CV:  Good peripheral perfusion. EOMI- pupils round and reactive Resp:  Normal effort.  Abd:  No distention.  Other:  Patient is hard of hearing.  Hearing aid noted.  She has got no chest wall tenderness no abdominal tenderness no tenderness of either arm.  She has got some tenderness on the  left knee and some tenderness on the left foot.  No ankle tenderness.  She is able to straight leg raise both legs.  She has got no obvious C-spine tenderness but a little bit of right paraspinal.  She is got very jagged laceration above the left eye through the left eyebrow with the skin having been scraped off, skin abrasion on the left cheek.  No significant tenderness on the bottom of the orbits Trace edema in legs bilaterally (pt reports is baseline)  ED Results / Procedures / Treatments   Labs (all labs ordered are listed, but only abnormal results are displayed) Labs Reviewed  CBC WITH DIFFERENTIAL/PLATELET  COMPREHENSIVE METABOLIC PANEL  URINALYSIS, ROUTINE W REFLEX MICROSCOPIC  TROPONIN I (HIGH SENSITIVITY)     EKG  My interpretation of EKG:  Normal sinus rate 65 without any ST elevation or T wave inversions except lead 3 normal intervals  RADIOLOGY I have reviewed the  xray personally and intrpretted and no fx of foot   PROCEDURES:  Critical Care performed: No  ..Laceration Repair  Date/Time: 06/14/2022 4:50 PM  Performed by: Concha Se, MD Authorized by: Concha Se, MD   Consent:    Consent obtained:  Verbal   Consent given by:  Patient   Risks discussed:  Pain, infection, need for additional repair, poor cosmetic result, tendon damage, retained foreign body, nerve damage, vascular damage and poor wound healing   Alternatives discussed:  No treatment Universal protocol:    Patient identity confirmed:  Verbally with patient Anesthesia:    Anesthesia method:  Local infiltration   Local anesthetic:  Lidocaine 2% WITH epi Laceration details:    Location:  Scalp   Scalp location:  Frontal   Length (cm):  5   Depth (mm):  5 Exploration:    Imaging outcome: foreign body not noted     Wound exploration: entire depth of wound visualized     Contaminated: no   Treatment:    Area cleansed with:  Saline   Amount of cleaning:  Standard Skin repair:     Repair method:  Sutures   Suture size:  6-0   Suture material:  Prolene   Suture technique:  Simple interrupted   Number of sutures:  6 Approximation:    Approximation:  Close Repair type:    Repair type:  Simple Post-procedure details:    Dressing:  Antibiotic ointment   Procedure completion:  Tolerated well, no immediate complications    MEDICATIONS ORDERED IN ED: Medications - No data to display   IMPRESSION / MDM / ASSESSMENT AND PLAN / ED COURSE  I reviewed the triage vital signs and the nursing notes. Differential: cervical fx, subdural, ACS, electrolyte abnormal  X-rays of tender areas were negative  IMPRESSION: Left frontal scalp swelling and laceration. No evidence of skull fracture or acute intracranial abnormality.   No evidence of cervical spine fracture or subluxation.   UA with many bacteria in it but patient is asymptomatic other than the occasional dizziness and will send for culture just in case patient does develop any symptoms patient to be called  Initial troponin is negative.  CBC reassuring.  CMP shows stable creatinine.  Patient's been amatory around the room and to the bathroom without any additional issues.  She continues to deny any chest pain, shortness of breath, dizziness.  She states that she would like to be able to go home.  She understand sutures need to be removed in 5 days.  Will get repeat troponin and if negative will discharge patient back.  Son is at bedside who is agreeable with this plan  Repeat troponin negative.  Patient remains at baseline.  She reports that the dizzy episode was just very brief and she did not have her cane by her which she usually uses to steady herself and that is why she fell.  I considered admission but given this does not sound syncopal in nature and she is at her baseline self she can be discharged home.     FINAL CLINICAL IMPRESSION(S) / ED DIAGNOSES   Final diagnoses:  Fall, initial encounter   Laceration of scalp, initial encounter     Rx / DC Orders   ED Discharge Orders     None        Note:  This document was prepared using Dragon voice recognition software and may include unintentional dictation errors.   Concha Se, MD 06/14/22  1656    Concha Se, MD 06/14/22 1818    Concha Se, MD 06/14/22 [phone]

## 2022-06-14 NOTE — ED Notes (Signed)
Patient was assisted to hallway bathroom via wheelchair. Patient had a steady gait from  wheelchair to the commode and back again. Family at bedside.

## 2022-06-14 NOTE — ED Notes (Signed)
Patient taken to CT scan.

## 2022-06-14 NOTE — ED Triage Notes (Addendum)
Pt BIB [facility] from home s/p fall resulting in head laceration. Pt reports she was walking out to her car to go grocery shopping, slipped on wet concrete, and fall to ground. Laceration to head hemostatic on arrival, bandage from EMS in place. Denies blood thinner use.

## 2022-06-14 NOTE — Discharge Instructions (Signed)
Get bacitracin or antibiotic ointment over-the-counter to rub on the left cheek wound and the laceration.  The sutures need to be removed in about 4 to 5 days.  There are 6 that will need to be removed.  Return to the ER for fevers, worsening symptoms or any other concerns

## 2022-06-16 LAB — URINE CULTURE

## 2022-06-17 LAB — URINE CULTURE

## 2022-06-19 NOTE — Progress Notes (Signed)
ED Antimicrobial Stewardship Positive Culture Follow Up   Veronica Graham is an 77 y.o. female who presented to [facility] on 06/14/2022 with a chief complaint of  Chief Complaint  Patient presents with   Fall    Recent Results (from the past 720 hour(s))  Urine Culture     Status: Abnormal   Collection Time: 06/14/22  3:01 PM   Specimen: Urine, Clean Catch  Result Value Ref Range Status   Specimen Description   Final    URINE, CLEAN CATCH Performed at [facility], [address] [place], [place] [address]    Special Requests   Final    NONE Performed at [facility], [address], [place], [place] [address]    Culture   Final    Two isolates with different morphologies were identified as the same organism.The most resistant organism was reported. >=100,000 COLONIES/mL KLEBSIELLA PNEUMONIAE    Report Status 06/17/2022 FINAL  Final   Organism ID, Bacteria KLEBSIELLA PNEUMONIAE (A)  Final      Susceptibility   Klebsiella pneumoniae - MIC*    AMPICILLIN RESISTANT Resistant     CEFAZOLIN <=4 SENSITIVE Sensitive     CEFEPIME <=0.12 SENSITIVE Sensitive     CEFTRIAXONE <=0.25 SENSITIVE Sensitive     CIPROFLOXACIN <=0.25 SENSITIVE Sensitive     GENTAMICIN <=1 SENSITIVE Sensitive     IMIPENEM <=0.25 SENSITIVE Sensitive     NITROFURANTOIN 128 RESISTANT Resistant     TRIMETH/SULFA <=20 SENSITIVE Sensitive     AMPICILLIN/SULBACTAM 4 SENSITIVE Sensitive     PIP/TAZO <=4 SENSITIVE Sensitive     * >=100,000 COLONIES/mL KLEBSIELLA PNEUMONIAE     [x]  Patient discharged originally without antimicrobial agent and treatment is now indicated  New antibiotic prescription: Cephalexin 500 mg po BID x 7 days  ED Provider: Dr. Criss Alvine 06/19/2022, 5:13 PM Clinical Pharmacist

## 2022-07-28 ENCOUNTER — Other Ambulatory Visit: Payer: Self-pay | Admitting: Family Medicine

## 2022-07-28 DIAGNOSIS — Z1231 Encounter for screening mammogram for malignant neoplasm of breast: Secondary | ICD-10-CM

## 2022-08-07 ENCOUNTER — Ambulatory Visit
Admission: RE | Admit: 2022-08-07 | Discharge: 2022-08-07 | Disposition: A | Discharge status: Home / Self Care | Payer: Medicare Other | Source: Ambulatory Visit | Attending: Family Medicine | Admitting: Family Medicine

## 2022-08-07 DIAGNOSIS — Z1231 Encounter for screening mammogram for malignant neoplasm of breast: Secondary | ICD-10-CM | POA: Diagnosis present

## 2023-05-11 ENCOUNTER — Emergency Department

## 2023-05-11 ENCOUNTER — Other Ambulatory Visit: Payer: Self-pay

## 2023-05-11 ENCOUNTER — Inpatient Hospital Stay
Admission: EM | Admit: 2023-05-11 | Discharge: 2023-05-14 | DRG: 660 | Disposition: A | Discharge status: Home / Self Care | Attending: Family Medicine | Admitting: Family Medicine

## 2023-05-11 DIAGNOSIS — G629 Polyneuropathy, unspecified: Secondary | ICD-10-CM | POA: Diagnosis present

## 2023-05-11 DIAGNOSIS — Z8249 Family history of ischemic heart disease and other diseases of the circulatory system: Secondary | ICD-10-CM

## 2023-05-11 DIAGNOSIS — F419 Anxiety disorder, unspecified: Secondary | ICD-10-CM | POA: Diagnosis present

## 2023-05-11 DIAGNOSIS — G4733 Obstructive sleep apnea (adult) (pediatric): Secondary | ICD-10-CM | POA: Diagnosis present

## 2023-05-11 DIAGNOSIS — F32A Depression, unspecified: Secondary | ICD-10-CM | POA: Diagnosis present

## 2023-05-11 DIAGNOSIS — I130 Hypertensive heart and chronic kidney disease with heart failure and stage 1 through stage 4 chronic kidney disease, or unspecified chronic kidney disease: Secondary | ICD-10-CM | POA: Diagnosis present

## 2023-05-11 DIAGNOSIS — B9620 Unspecified Escherichia coli [E. coli] as the cause of diseases classified elsewhere: Secondary | ICD-10-CM | POA: Diagnosis present

## 2023-05-11 DIAGNOSIS — N136 Pyonephrosis: Secondary | ICD-10-CM | POA: Diagnosis not present

## 2023-05-11 DIAGNOSIS — R109 Unspecified abdominal pain: Secondary | ICD-10-CM

## 2023-05-11 DIAGNOSIS — N179 Acute kidney failure, unspecified: Secondary | ICD-10-CM

## 2023-05-11 DIAGNOSIS — N200 Calculus of kidney: Principal | ICD-10-CM

## 2023-05-11 DIAGNOSIS — N390 Urinary tract infection, site not specified: Secondary | ICD-10-CM | POA: Diagnosis not present

## 2023-05-11 DIAGNOSIS — I160 Hypertensive urgency: Secondary | ICD-10-CM

## 2023-05-11 DIAGNOSIS — Z96653 Presence of artificial knee joint, bilateral: Secondary | ICD-10-CM | POA: Diagnosis present

## 2023-05-11 DIAGNOSIS — Z6841 Body Mass Index (BMI) 40.0 and over, adult: Secondary | ICD-10-CM

## 2023-05-11 DIAGNOSIS — Z825 Family history of asthma and other chronic lower respiratory diseases: Secondary | ICD-10-CM

## 2023-05-11 DIAGNOSIS — E66813 Obesity, class 3: Secondary | ICD-10-CM | POA: Diagnosis present

## 2023-05-11 DIAGNOSIS — F418 Other specified anxiety disorders: Secondary | ICD-10-CM | POA: Diagnosis present

## 2023-05-11 DIAGNOSIS — H9190 Unspecified hearing loss, unspecified ear: Secondary | ICD-10-CM | POA: Diagnosis present

## 2023-05-11 DIAGNOSIS — Z9884 Bariatric surgery status: Secondary | ICD-10-CM

## 2023-05-11 DIAGNOSIS — Z9049 Acquired absence of other specified parts of digestive tract: Secondary | ICD-10-CM

## 2023-05-11 DIAGNOSIS — Z1152 Encounter for screening for COVID-19: Secondary | ICD-10-CM

## 2023-05-11 DIAGNOSIS — N138 Other obstructive and reflux uropathy: Secondary | ICD-10-CM | POA: Diagnosis present

## 2023-05-11 DIAGNOSIS — N132 Hydronephrosis with renal and ureteral calculous obstruction: Secondary | ICD-10-CM

## 2023-05-11 DIAGNOSIS — N1832 Chronic kidney disease, stage 3b: Secondary | ICD-10-CM | POA: Diagnosis present

## 2023-05-11 DIAGNOSIS — M1990 Unspecified osteoarthritis, unspecified site: Secondary | ICD-10-CM | POA: Diagnosis present

## 2023-05-11 DIAGNOSIS — Z853 Personal history of malignant neoplasm of breast: Secondary | ICD-10-CM

## 2023-05-11 DIAGNOSIS — J45909 Unspecified asthma, uncomplicated: Secondary | ICD-10-CM | POA: Diagnosis present

## 2023-05-11 DIAGNOSIS — R1032 Left lower quadrant pain: Secondary | ICD-10-CM | POA: Diagnosis not present

## 2023-05-11 DIAGNOSIS — Z79899 Other long term (current) drug therapy: Secondary | ICD-10-CM

## 2023-05-11 DIAGNOSIS — G4730 Sleep apnea, unspecified: Secondary | ICD-10-CM | POA: Insufficient documentation

## 2023-05-11 DIAGNOSIS — N3941 Urge incontinence: Secondary | ICD-10-CM | POA: Diagnosis present

## 2023-05-11 DIAGNOSIS — I5032 Chronic diastolic (congestive) heart failure: Secondary | ICD-10-CM | POA: Insufficient documentation

## 2023-05-11 DIAGNOSIS — Z9221 Personal history of antineoplastic chemotherapy: Secondary | ICD-10-CM

## 2023-05-11 LAB — RESP PANEL BY RT-PCR (RSV, FLU A&B, COVID)  RVPGX2
Influenza A by PCR: NEGATIVE
Influenza B by PCR: NEGATIVE
Resp Syncytial Virus by PCR: NEGATIVE
SARS Coronavirus 2 by RT PCR: NEGATIVE

## 2023-05-11 LAB — URINALYSIS, ROUTINE W REFLEX MICROSCOPIC
Bilirubin Urine: NEGATIVE
Glucose, UA: 50 mg/dL — AB
Ketones, ur: NEGATIVE mg/dL
Nitrite: NEGATIVE
Protein, ur: 100 mg/dL — AB
Specific Gravity, Urine: 1.012 (ref 1.005–1.030)
pH: 6 (ref 5.0–8.0)

## 2023-05-11 LAB — CBC WITH DIFFERENTIAL/PLATELET
Abs Immature Granulocytes: 0.08 10*3/uL — ABNORMAL HIGH (ref 0.00–0.07)
Basophils Absolute: 0.1 10*3/uL (ref 0.0–0.1)
Basophils Relative: 0 %
Eosinophils Absolute: 0 10*3/uL (ref 0.0–0.5)
Eosinophils Relative: 0 %
HCT: 44 % (ref 36.0–46.0)
Hemoglobin: 14.6 g/dL (ref 12.0–15.0)
Immature Granulocytes: 1 %
Lymphocytes Relative: 5 %
Lymphs Abs: 0.9 10*3/uL (ref 0.7–4.0)
MCH: 30.5 pg (ref 26.0–34.0)
MCHC: 33.2 g/dL (ref 30.0–36.0)
MCV: 92.1 fL (ref 80.0–100.0)
Monocytes Absolute: 1.1 10*3/uL — ABNORMAL HIGH (ref 0.1–1.0)
Monocytes Relative: 6 %
Neutro Abs: 15.6 10*3/uL — ABNORMAL HIGH (ref 1.7–7.7)
Neutrophils Relative %: 88 %
Platelets: 260 10*3/uL (ref 150–400)
RBC: 4.78 MIL/uL (ref 3.87–5.11)
RDW: 13 % (ref 11.5–15.5)
WBC: 17.7 10*3/uL — ABNORMAL HIGH (ref 4.0–10.5)
nRBC: 0 % (ref 0.0–0.2)

## 2023-05-11 LAB — COMPREHENSIVE METABOLIC PANEL WITH GFR
ALT: 17 U/L (ref 0–44)
AST: 28 U/L (ref 15–41)
Albumin: 4.2 g/dL (ref 3.5–5.0)
Alkaline Phosphatase: 167 U/L — ABNORMAL HIGH (ref 38–126)
Anion gap: 14 (ref 5–15)
BUN: 22 mg/dL (ref 8–23)
CO2: 20 mmol/L — ABNORMAL LOW (ref 22–32)
Calcium: 8.9 mg/dL (ref 8.9–10.3)
Chloride: 106 mmol/L (ref 98–111)
Creatinine, Ser: 1.53 mg/dL — ABNORMAL HIGH (ref 0.44–1.00)
GFR, Estimated: 35 mL/min — ABNORMAL LOW (ref >60)
Glucose, Bld: 188 mg/dL — ABNORMAL HIGH (ref 70–99)
Potassium: 3.2 mmol/L — ABNORMAL LOW (ref 3.5–5.1)
Sodium: 140 mmol/L (ref 135–145)
Total Bilirubin: 1.1 mg/dL (ref 0.0–1.2)
Total Protein: 8.1 g/dL (ref 6.5–8.1)

## 2023-05-11 LAB — LIPASE, BLOOD: Lipase: 20 U/L (ref 11–51)

## 2023-05-11 MED ORDER — SODIUM CHLORIDE 0.9 % IV SOLN
1.0000 g | INTRAVENOUS | Status: DC
  Administered 2023-05-12 – 2023-05-13 (×2): 1 g via INTRAVENOUS
  Filled 2023-05-11 (×3): qty 10

## 2023-05-11 MED ORDER — ONDANSETRON HCL 4 MG/2ML IJ SOLN
4.0000 mg | Freq: Four times a day (QID) | INTRAMUSCULAR | Status: DC | PRN
  Administered 2023-05-11: 4 mg via INTRAVENOUS
  Filled 2023-05-11: qty 2

## 2023-05-11 MED ORDER — ONDANSETRON HCL 4 MG PO TABS: 4.0000 mg | ORAL_TABLET | Freq: Four times a day (QID) | ORAL | Status: DC | PRN

## 2023-05-11 MED ORDER — LACTATED RINGERS IV BOLUS
500.0000 mL | Freq: Once | INTRAVENOUS | Status: AC
  Administered 2023-05-11: 500 mL via INTRAVENOUS

## 2023-05-11 MED ORDER — MORPHINE SULFATE (PF) 2 MG/ML IV SOLN
2.0000 mg | Freq: Once | INTRAVENOUS | Status: AC
  Administered 2023-05-11: 2 mg via INTRAVENOUS
  Filled 2023-05-11: qty 1

## 2023-05-11 MED ORDER — IOHEXOL 300 MG/ML  SOLN
75.0000 mL | Freq: Once | INTRAMUSCULAR | Status: AC | PRN
  Administered 2023-05-11: 75 mL via INTRAVENOUS

## 2023-05-11 MED ORDER — OXYCODONE HCL 5 MG PO TABS
5.0000 mg | ORAL_TABLET | ORAL | Status: DC | PRN
  Administered 2023-05-12 – 2023-05-13 (×2): 5 mg via ORAL
  Filled 2023-05-11 (×2): qty 1

## 2023-05-11 MED ORDER — MORPHINE SULFATE (PF) 2 MG/ML IV SOLN
2.0000 mg | INTRAVENOUS | Status: DC | PRN
  Administered 2023-05-11 – 2023-05-12 (×4): 2 mg via INTRAVENOUS
  Filled 2023-05-11 (×4): qty 1

## 2023-05-11 MED ORDER — ACETAMINOPHEN 325 MG PO TABS
650.0000 mg | ORAL_TABLET | Freq: Four times a day (QID) | ORAL | Status: DC | PRN
  Administered 2023-05-11 – 2023-05-13 (×3): 650 mg via ORAL
  Filled 2023-05-11 (×3): qty 2

## 2023-05-11 MED ORDER — SODIUM CHLORIDE 0.9 % IV SOLN
1.0000 g | Freq: Once | INTRAVENOUS | Status: AC
  Administered 2023-05-11: 1 g via INTRAVENOUS
  Filled 2023-05-11: qty 10

## 2023-05-11 MED ORDER — ACETAMINOPHEN 650 MG RE SUPP: 650.0000 mg | Freq: Four times a day (QID) | RECTAL | Status: DC | PRN

## 2023-05-11 NOTE — Assessment & Plan Note (Signed)
 Complicated UTI IV Rocephin Pain control N.p.o. from midnight Urology aware and formally consulted

## 2023-05-11 NOTE — Assessment & Plan Note (Signed)
 BP uncontrolled, partly related to pain Resume home Diovan and carvedilol Hydralazine as needed for additional BP control

## 2023-05-11 NOTE — ED Provider Triage Note (Signed)
 Emergency Medicine Provider Triage Evaluation Note  Veronica Graham , a 78 y.o. female  was evaluated in triage.  Pt complains of LLQ pain. Pain began yesterday but it has worsened today. No hx of kidney stones, diverticulitis.  Review of Systems  Positive: Nausea, LLQ pain Negative: Fevers, diarrhea  Physical Exam  LMP  (LMP Unknown)  Gen:   Awake, no distress   Resp:  Normal effort  MSK:   Moves extremities without difficulty  Other:    Medical Decision Making  Medically screening exam initiated at 4:35 PM.  Appropriate orders placed.  Precious Bard was informed that the remainder of the evaluation will be completed by another provider, this initial triage assessment does not replace that evaluation, and the importance of remaining in the ED until their evaluation is complete.     Cameron Ali, PA-C 05/11/23 1637

## 2023-05-11 NOTE — Assessment & Plan Note (Signed)
CPAP nightly if desired °

## 2023-05-11 NOTE — Hospital Course (Signed)
 morbid obesity, obstructive sleep apnea not on CPAP, stage IIIa chronic kidney disease, breast cancer status postchemotherapy, diastolic CHF and hypertension, being admitted for renal colic with an obstructing ureteral stone associated with possible UTI.  She presented to the ED with a 1 day history of left lower quadrant pain, nausea without vomiting.  She denies fever or chills, dysuria or hematuria.  Has no chest pain or shortness of breath.  No prior history of kidney stones.  Patient does have urgent continence for which she saw her PCP a month ago and was treated empirically for UTI with Cipro. ED course and data review: BP 214/119 on arrival, improving to 175/95 with pain control.  Low-grade temp of 99.5, no tachycardia or tachypnea. Labs notable for WBC of 17,000, trace leukocytes and rare bacteria on urinalysis Labs otherwise notable for potassium 3.2, creatinine 5 3 which is her baseline. Lipase and LFTs WNL except for slightly elevated alk phos of 167 CBC otherwise unremarkable CT abdomen and pelvis showed a 7 mm distal left ureteral stone with moderate collecting system dilatation above this and several additional nonobstructing left-sided intrarenal stones. CT abdomen and pelvis also showed small amount of air in the endometrium. Follow-up pelvic ultrasound showed no abnormality.  Patient declined transvaginal imaging. Patient given a fluid bolus, morphine for pain started on ceftriaxone The ED provider spoke with urologist, Dr. Renne Musca who advised to keep her n.p.o. for possible stent in the a.m.

## 2023-05-11 NOTE — Assessment & Plan Note (Signed)
 Clinically euvolemic Continue GDMT with carvedilol, ARB and torsemide

## 2023-05-11 NOTE — ED Provider Notes (Signed)
 [facility] Provider Note    Event Date/Time   First MD Initiated Contact with Patient 05/11/23 1714     (approximate)   History   Abdominal Pain   HPI  MARGUITA VENNING is a 78 y.o. female with history of CKD, hypertension, depression with anxiety, CHF, presenting with left flank left lower quadrant abdominal pain that started yesterday.  Associated with some dry heaving, no diarrhea, she denies any dysuria or hematuria.  No fever, chest pain, shortness of breath.  Forgot to take her blood pressure medications today.  States no prior history of kidney stones.  On independent chart review, she was seen by her primary care doctor at the end of February for urinary incontinence, has urge but does not have time to make it to the bathroom, they were concerned about urinary tract infection for her, treated her empirically with Cipro.     Physical Exam   Triage Vital Signs: ED Triage Vitals  Encounter Vitals Group     BP 05/11/23 1646 (!) 214/119     Systolic BP Percentile --      Diastolic BP Percentile --      Pulse Rate 05/11/23 1639 92     Resp 05/11/23 1639 20     Temp 05/11/23 1639 99.3 F (37.4 C)     Temp Source 05/11/23 1639 Oral     SpO2 05/11/23 1639 98 %     Weight --      Height --      Head Circumference --      Peak Flow --      Pain Score 05/11/23 1639 10     Pain Loc --      Pain Education --      Exclude from Growth Chart --     Most recent vital signs: Vitals:   05/11/23 1646 05/11/23 1751  BP: (!) 214/119 (!) 177/106  Pulse:    Resp:    Temp:    SpO2:       General: Awake, no distress.  CV:  Good peripheral perfusion.  Resp:  Normal effort.  Abd:  No distention.  Abdomen soft, tender to the left periumbilical region, no guarding, no tenderness to her flank Other:  No CVA tenderness.   ED Results / Procedures / Treatments   Labs (all labs ordered are listed, but only abnormal results are displayed) Labs Reviewed   COMPREHENSIVE METABOLIC PANEL WITH GFR - Abnormal; Notable for the following components:      Result Value   Potassium 3.2 (*)    CO2 20 (*)    Glucose, Bld 188 (*)    Creatinine, Ser 1.53 (*)    Alkaline Phosphatase 167 (*)    GFR, Estimated 35 (*)    All other components within normal limits  CBC WITH DIFFERENTIAL/PLATELET - Abnormal; Notable for the following components:   WBC 17.7 (*)    Neutro Abs 15.6 (*)    Monocytes Absolute 1.1 (*)    Abs Immature Granulocytes 0.08 (*)    All other components within normal limits  URINALYSIS, ROUTINE W REFLEX MICROSCOPIC - Abnormal; Notable for the following components:   Color, Urine STRAW (*)    APPearance CLEAR (*)    Glucose, UA 50 (*)    Hgb urine dipstick SMALL (*)    Protein, ur 100 (*)    Leukocytes,Ua TRACE (*)    Bacteria, UA RARE (*)    All other components within normal  limits  RESP PANEL BY RT-PCR (RSV, FLU A&B, COVID)  RVPGX2  LIPASE, BLOOD  BASIC METABOLIC PANEL WITH GFR  CBC    RADIOLOGY CT abdomen pelvis on my independent interpretation showed left hydronephrosis   PROCEDURES:  Critical Care performed: No  Procedures   MEDICATIONS ORDERED IN ED: Medications  acetaminophen (TYLENOL) tablet 650 mg (has no administration in time range)    Or  acetaminophen (TYLENOL) suppository 650 mg (has no administration in time range)  ondansetron (ZOFRAN) tablet 4 mg (has no administration in time range)    Or  ondansetron (ZOFRAN) injection 4 mg (has no administration in time range)  oxyCODONE (Oxy IR/ROXICODONE) immediate release tablet 5 mg (has no administration in time range)  morphine (PF) 2 MG/ML injection 2 mg (has no administration in time range)  lactated ringers bolus 500 mL (500 mLs Intravenous New Bag/Given 05/11/23 1807)  morphine (PF) 2 MG/ML injection 2 mg (2 mg Intravenous Given 05/11/23 1750)  iohexol (OMNIPAQUE) 300 MG/ML solution 75 mL (75 mLs Intravenous Contrast Given 05/11/23 1733)  morphine (PF)  2 MG/ML injection 2 mg (2 mg Intravenous Given 05/11/23 1856)  cefTRIAXone (ROCEPHIN) 1 g in sodium chloride 0.9 % 100 mL IVPB (1 g Intravenous New Bag/Given 05/11/23 1904)     IMPRESSION / MDM / ASSESSMENT AND PLAN / ED COURSE  I reviewed the triage vital signs and the nursing notes.                              Differential diagnosis includes, but is not limited to, nephrolithiasis, UTI, pyelonephritis, colitis, diverticulitis.  Also consider electrolyte derangements.  Suspect her blood pressure is due to her missing her medications today as well as pain.  Will give her some IV morphine, IV fluids, get labs, UA, CT abdomen pelvis.  Reassess.  Patient's presentation is most consistent with acute presentation with potential threat to life or bodily function.  Independent review of labs and imaging as well as clinical course of below.  On reassessment patient is still having some pain.  Will give her another dose of IV morphine.  Her UA is consistent with UTI, on independent review, prior urine culture showed Klebsiella susceptible to ceftriaxone, will give her a dose of IV ceftriaxone here.  Given her kidney stone with hydronephrosis as well as UTI, concerning for infected stone, will reach out to urology for additional recs but will plan to have her admitted for further management.  Consult urology and they recommended admission, n.p.o. and they will see in the a.m. for possible stenting.  States that the urine is relatively benign.  Will consult hospitalist for admission.  Consult to hospitalist was agreeable plan for admission, and will evaluate the patient.  She is admitted, also discussed with hospitalist about incidental air along the endometrium that was noted on CT, and that ultrasound is pending.  She will follow-up.  Patient is admitted.  Clinical Course as of 05/11/23 1947  Mon May 11, 2023  1727 Independent review of labs, she has a leukocytosis, electrolytes not severely deranged,  creatinine is mildly elevated compared to prior, her alk phos is elevated but this is consistent compared to labs from 11 months ago.  Lipase is normal. [TT]  1755 CT ABDOMEN PELVIS W CONTRAST IMPRESSION: 7 mm stone in the distal left ureter approximately 3 cm proximal to the UVJ. Moderate collecting system dilatation above this. Several additional nonobstructing left-sided intrarenal stones.  Small amount of air seen along the endometrium. Please correlate with any particular symptoms or history including for infection or recent intervention. Further workup with pelvic ultrasound as clinically appropriate.  Surgical changes of prior gastric bypass.  No bowel obstruction.  Evaluation limited by motion   [TT]  1759 CT imaging noted some air along the endometrium, patient denies any vaginal bleeding or discharge, no recent instrumentation, no pelvic pain.  She denies any STI history, is not very sexually active now.  States the only most recent instrumentation done is a D&C in her 1s. [TT]  1846 Urinalysis, Routine w reflex microscopic -Urine, Clean Catch(!) UA shows 25-50 whites with red bacteria, trace leuk esterase.  Is concerning for UTI. [TT]    Clinical Course User Index [TT] Claybon Jabs, MD     FINAL CLINICAL IMPRESSION(S) / ED DIAGNOSES   Final diagnoses:  Left lower quadrant abdominal pain  Left flank pain  Kidney stone  Urinary tract infection without hematuria, site unspecified  AKI (acute kidney injury) (HCC)     Rx / DC Orders   ED Discharge Orders     None        Note:  This document was prepared using Dragon voice recognition software and may include unintentional dictation errors.    Claybon Jabs, MD 05/11/23 [phone]

## 2023-05-11 NOTE — Assessment & Plan Note (Signed)
 Continue home SSRI and anxiolytics

## 2023-05-11 NOTE — Assessment & Plan Note (Signed)
 Complicating factor to overall prognosis and care

## 2023-05-11 NOTE — H&P (Signed)
 History and Physical    Patient: Veronica Graham ZOX:[id number] DOB: [birth year] DOA: 05/11/2023 DOS: the patient was seen and examined on 05/11/2023 PCP: Kandyce Rud, MD  Patient coming from: Home  Chief Complaint:  Chief Complaint  Patient presents with   Abdominal Pain    HPI: Veronica Graham is a 78 y.o. female with medical history significant for morbid obesity, obstructive sleep apnea not on CPAP, stage IIIa chronic kidney disease, breast cancer status postchemotherapy, diastolic CHF and hypertension, being admitted for renal colic with an obstructing ureteral stone associated with possible UTI.  She presented to the ED with a 1 day history of left lower quadrant pain, nausea without vomiting.  She denies fever or chills, dysuria or hematuria.  Has no chest pain or shortness of breath.  No prior history of kidney stones.  Patient does have urgent continence for which she saw her PCP a month ago and was treated empirically for UTI with Cipro. ED course and data review: BP 214/119 on arrival, improving to 175/95 with pain control.  Low-grade temp of 99.5, no tachycardia or tachypnea. Labs notable for WBC of 17,000, trace leukocytes and rare bacteria on urinalysis Labs otherwise notable for potassium 3.2, creatinine 5 3 which is her baseline. Lipase and LFTs WNL except for slightly elevated alk phos of 167 CBC otherwise unremarkable CT abdomen and pelvis showed a 7 mm distal left ureteral stone with moderate collecting system dilatation above this and several additional nonobstructing left-sided intrarenal stones. CT abdomen and pelvis also showed small amount of air in the endometrium. Follow-up pelvic ultrasound showed no abnormality.  Patient declined transvaginal imaging. Patient given a fluid bolus, morphine for pain started on ceftriaxone The ED provider spoke with urologist, Dr. Renne Musca who advised to keep her n.p.o. for possible stent in the a.m.   Review of Systems: As  mentioned in the history of present illness. All other systems reviewed and are negative.  Past Medical History:  Diagnosis Date   Arthritis    Asthma    Breast cancer (HCC) 2003   right breast   Breast cancer (HCC)    CHF (congestive heart failure) (HCC)    Chronic kidney disease    Depression    Environmental and seasonal allergies    HOH (hard of hearing)    Hypertension    Lower extremity edema    Obesity    Personal history of chemotherapy 2003   Rt. breast   Sinus headache    Sleep apnea    does not use CPAP   Past Surgical History:  Procedure Laterality Date   APPENDECTOMY     BREAST EXCISIONAL BIOPSY Right 2003   positve   BTL     CATARACT EXTRACTION W/PHACO Right 03/31/2017   Procedure: CATARACT EXTRACTION PHACO AND INTRAOCULAR LENS PLACEMENT (IOC);  Surgeon: Galen Manila, MD;  Location: [facility];  Service: Ophthalmology;  Laterality: Right;  [place] 00:38.2 AP% 18.2 CDE 6.95 Fluid Pcak lot # [id number]   CATARACT EXTRACTION W/PHACO Left 04/22/2017   Procedure: CATARACT EXTRACTION PHACO AND INTRAOCULAR LENS PLACEMENT (IOC);  Surgeon: Galen Manila, MD;  Location: [facility];  Service: Ophthalmology;  Laterality: Left;  [place] 00:39.0 AP% 12.0 CDE 4.68 Fluid Pack Lot # [id number]   CHOLECYSTECTOMY     COLONOSCOPY N/A 12/18/2021   Procedure: COLONOSCOPY;  Surgeon: Toledo, Boykin Nearing, MD;  Location: [facility];  Service: Gastroenterology;  Laterality: N/A;   ESOPHAGOGASTRODUODENOSCOPY N/A 12/18/2021   Procedure: ESOPHAGOGASTRODUODENOSCOPY (EGD);  Surgeon: [place], Cordova  K, MD;  Location: [facility];  Service: Gastroenterology;  Laterality: N/A;   EYE SURGERY     GASTRIC BYPASS     JOINT REPLACEMENT     MASTECTOMY, PARTIAL     REPLACEMENT TOTAL KNEE BILATERAL     TUBAL LIGATION     Social History:  reports that she has never smoked. She has never used smokeless tobacco. She reports that she does not drink alcohol and does not use drugs.  Allergies  Allergen  Reactions   Other Other (See Comments)    DO NOT USE BLOOD PRESSURE CUFF OR NEEDLES (IVs) in RIGHT ARM    Family History  Problem Relation Age of Onset   Heart failure Mother    COPD Father    CAD Brother     Prior to Admission medications   Medication Sig Start Date End Date Taking? Authorizing Provider  albuterol (PROVENTIL HFA;VENTOLIN HFA) 108 (90 Base) MCG/ACT inhaler Inhale 2 puffs into the lungs every 4 (four) hours as needed for wheezing or shortness of breath.    [provider]  ALPRAZolam Prudy Feeler) 0.5 MG tablet Take 0.5 mg by mouth 2 (two) times daily as needed for anxiety (pt takes at bedtime each night).    [provider]  calcitRIOL (ROCALTROL) 0.25 MCG capsule Take 0.25 mcg by mouth daily. Patient not taking: Reported on 12/18/2021 04/22/21   [provider]  carvedilol (COREG) 3.125 MG tablet Take 3.125 mg by mouth 2 (two) times daily. 03/21/21   [provider]  Chlorphen-Pseudoephed-APAP (TYLENOL ALLERGY COMPLETE PO) Take 2 tablets by mouth at bedtime.    [provider]  citalopram (CELEXA) 20 MG tablet Take 10 mg by mouth daily. Patient not taking: Reported on 12/18/2021    [provider]  FARXIGA 10 MG TABS tablet Take 10 mg by mouth every morning. 04/23/21   [provider]  KLOR-CON M20 20 MEQ tablet TAKE 1 TABLET BY MOUTH EVERY DAY 09/06/21   Clarisa Kindred A, FNP  montelukast (SINGULAIR) 10 MG tablet Take 10 mg by mouth at bedtime.    [provider]  NON FORMULARY Place 1 drop into the right eye 2 (two) times daily. Imprimis Eye Drop Patient not taking: Reported on 10/28/2021    [provider]  omeprazole (PRILOSEC) 20 MG capsule Take 20 mg by mouth 2 (two) times daily. 02/18/21   [provider]  torsemide (DEMADEX) 20 MG tablet Take 10 mg by mouth daily. 03/21/21   [provider]  valsartan (DIOVAN) 160 MG tablet Take 160 mg by mouth daily. Patient not taking: Reported on  12/18/2021 01/03/20   [provider]  venlafaxine XR (EFFEXOR-XR) 150 MG 24 hr capsule Take 150 mg by mouth daily. Patient not taking: Reported on 12/18/2021 02/20/21   [provider]  losartan-hydrochlorothiazide (HYZAAR) 100-25 MG tablet Take 1 tablet by mouth daily.  02/07/20  [provider]    Physical Exam: Vitals:   05/11/23 1646 05/11/23 1751 05/11/23 2101 05/11/23 2208  BP: (!) 214/119 (!) 177/106 (!) 175/95 (!) 175/93  Pulse:   89 100  Resp:   18   Temp:   99.7 F (37.6 C) (!) 100.7 F (38.2 C)  TempSrc:   Oral Oral  SpO2:   98% 95%   Physical Exam Vitals and nursing note reviewed.  Constitutional:      General: She is not in acute distress. HENT:     Head: Normocephalic and atraumatic.  Cardiovascular:  Rate and Rhythm: Normal rate and regular rhythm.     Heart sounds: Normal heart sounds.  Pulmonary:     Effort: Pulmonary effort is normal.     Breath sounds: Normal breath sounds.  Abdominal:     Palpations: Abdomen is soft.     Tenderness: There is no abdominal tenderness.  Neurological:     Mental Status: Mental status is at baseline.     Labs on Admission: I have personally reviewed following labs and imaging studies  CBC: Recent Labs  Lab 05/11/23 1637  WBC 17.7*  NEUTROABS 15.6*  HGB 14.6  HCT 44.0  MCV 92.1  PLT 260   Basic Metabolic Panel: Recent Labs  Lab 05/11/23 1637  NA 140  K 3.2*  CL 106  CO2 20*  GLUCOSE 188*  BUN 22  CREATININE 1.53*  CALCIUM 8.9   GFR: CrCl cannot be calculated (Unknown ideal weight.). Liver Function Tests: Recent Labs  Lab 05/11/23 1637  AST 28  ALT 17  ALKPHOS 167*  BILITOT 1.1  PROT 8.1  ALBUMIN 4.2   Recent Labs  Lab 05/11/23 1637  LIPASE 20   No results for input(s): "AMMONIA" in the last 168 hours. Coagulation Profile: No results for input(s): "INR", "PROTIME" in the last 168 hours. Cardiac Enzymes: No results for input(s): "CKTOTAL", "CKMB",  "CKMBINDEX", "TROPONINI" in the last 168 hours. BNP (last 3 results) No results for input(s): "PROBNP" in the last 8760 hours. HbA1C: No results for input(s): "HGBA1C" in the last 72 hours. CBG: No results for input(s): "GLUCAP" in the last 168 hours. Lipid Profile: No results for input(s): "CHOL", "HDL", "LDLCALC", "TRIG", "CHOLHDL", "LDLDIRECT" in the last 72 hours. Thyroid Function Tests: No results for input(s): "TSH", "T4TOTAL", "FREET4", "T3FREE", "THYROIDAB" in the last 72 hours. Anemia Panel: No results for input(s): "VITAMINB12", "FOLATE", "FERRITIN", "TIBC", "IRON", "RETICCTPCT" in the last 72 hours. Urine analysis:    Component Value Date/Time   COLORURINE STRAW (A) 05/11/2023 1755   APPEARANCEUR CLEAR (A) 05/11/2023 1755   LABSPEC 1.012 05/11/2023 1755   PHURINE 6.0 05/11/2023 1755   GLUCOSEU 50 (A) 05/11/2023 1755   HGBUR SMALL (A) 05/11/2023 1755   BILIRUBINUR NEGATIVE 05/11/2023 1755   KETONESUR NEGATIVE 05/11/2023 1755   PROTEINUR 100 (A) 05/11/2023 1755   NITRITE NEGATIVE 05/11/2023 1755   LEUKOCYTESUR TRACE (A) 05/11/2023 1755    Radiological Exams on Admission: US PELVIS (TRANSABDOMINAL ONLY) Result Date: 05/11/2023 CLINICAL DATA:  Left lower quadrant pain. Air within the endometrium. EXAM: TRANSABDOMINAL ULTRASOUND OF PELVIS TECHNIQUE: Transabdominal ultrasound examination of the pelvis was performed including evaluation of the uterus, ovaries, adnexal regions, and pelvic cul-de-sac. COMPARISON:  CT today. FINDINGS: Uterus Measurements: 9.1 x 3.5 x 6.3 cm = volume: 104 mL. No fibroids or other mass visualized. Endometrium Thickness: 2 mm in thickness. No focal abnormality visualized. No visible endometrial gas transabdominally. Patient refused transvaginal imaging. Right ovary Measurements: Not visualized.  No adnexal mass seen. Left ovary Measurements: Not visualized.  No adnexal mass seen. Other findings:  No abnormal free fluid. IMPRESSION: No abnormality  visualized on transabdominal imaging. Patient refused transvaginal imaging. Electronically Signed   By: Charlett Nose M.D.   On: 05/11/2023 20:26   CT ABDOMEN PELVIS W CONTRAST Result Date: 05/11/2023 CLINICAL DATA:  Left lower quadrant pain that has progressed since yesterday. EXAM: CT ABDOMEN AND PELVIS WITH CONTRAST TECHNIQUE: Multidetector CT imaging of the abdomen and pelvis was performed using the standard protocol following bolus administration of intravenous contrast. RADIATION DOSE  REDUCTION: This exam was performed according to the departmental dose-optimization program which includes automated exposure control, adjustment of the mA and/or kV according to patient size and/or use of iterative reconstruction technique. CONTRAST:  75mL OMNIPAQUE IOHEXOL 300 MG/ML  SOLN COMPARISON:  Noncontrast CT 04/11/2021. FINDINGS: Lower chest: Motion at the lung bases limits evaluation. Mild basilar atelectasis suggested. No pleural effusion. Heart is slightly enlarged. Coronary artery calcifications are seen. Significant calcifications along the mitral valve annulus as well. Slightly patulous esophagus. Hepatobiliary: Previous cholecystectomy. Significant streak artifact from the clips at the hilum. No clear space-occupying liver lesion. Patent portal vein. Pancreas: Severe atrophy of the pancreas, unchanged from previous. No separate mass. Spleen: Normal in size without focal abnormality. Adrenals/Urinary Tract: The adrenal glands are preserved. Moderate atrophy of the right kidney. Mild perinephric stranding right ureter has a normal course and caliber down to the bladder. Preserved contour to the urinary bladder. There is severe perinephric stranding on the left. Global atrophy. Moderate collecting system dilatation seen diffusely. There is a stone in the distal left ureter. On coronal series 5, image 70 this measures 8 mm. Stone is seen approximately 3 cm proximal to the UVJ. No additional ureteral stone. There are  several small stones in the lower pole left kidney as well. 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 The residual stomach is nondilated. The small bowel is nondilated. Large bowel also has a normal course and caliber. Slightly redundant course of the sigmoid colon. Few colonic diverticula. The appendix is poorly seen in the right lower quadrant. No pericecal stranding or fluid. Vascular/Lymphatic: Aortic atherosclerosis. No enlarged abdominal or pelvic lymph nodes. Reproductive: Uterus is present. No separate adnexal mass. There is some air along the endometrium. Please correlate for any known history or symptomatology. Other: Evaluation limited by motion throughout the examination. Musculoskeletal: Moderate degenerative changes of the spine and pelvis. Curvature of the spine as well. Multilevel stenosis suggested along the lumbar spine. IMPRESSION: 7 mm stone in the distal left ureter approximately 3 cm proximal to the UVJ. Moderate collecting system dilatation above this. Several additional nonobstructing left-sided intrarenal stones. Small amount of air seen along the endometrium. Please correlate with any particular symptoms or history including for infection or recent intervention. Further workup with pelvic ultrasound as clinically appropriate. Surgical changes of prior gastric bypass.  No bowel obstruction. Evaluation limited by motion Electronically Signed   By: Karen Kays M.D.   On: 05/11/2023 17:52     Data Reviewed: Relevant notes from primary care and specialist visits, past discharge summaries as available in EHR, including Care Everywhere. Prior diagnostic testing as pertinent to current admission diagnoses Updated medications and problem lists for reconciliation ED course, including vitals, labs, imaging, treatment and response to treatment Triage notes, nursing and pharmacy notes and ED provider's notes Notable results as noted in HPI   Assessment and Plan: Ureteral  stone with obstruction Complicated UTI IV Rocephin Pain control N.p.o. from midnight Urology aware and formally consulted  Hypertensive urgency BP uncontrolled, partly related to pain Resume home Diovan and carvedilol Hydralazine as needed for additional BP control  Chronic kidney disease, stage 3b (HCC) Renal function at baseline Monitor for worsening history of obstructing ureteral calculus  Depression with anxiety Continue home SSRI and anxiolytics  Obesity, Class III, BMI 40-49.9 (morbid obesity) (HCC) Complicating factor to overall prognosis and care  Sleep apnea CPAP nightly if desired  Chronic heart failure with preserved ejection fraction (HFpEF) (HCC) Clinically euvolemic Continue GDMT with carvedilol, ARB  and torsemide        DVT prophylaxis:n SCD  Consults: urology, Dr Richardo Hanks  Advance Care Planning:   Code Status: Full Code   Family Communication: none  Disposition Plan: Back to previous home environment  Severity of Illness: The appropriate patient status for this patient is OBSERVATION. Observation status is judged to be reasonable and necessary in order to provide the required intensity of service to ensure the patient's safety. The patient's presenting symptoms, physical exam findings, and initial radiographic and laboratory data in the context of their medical condition is felt to place them at decreased risk for further clinical deterioration. Furthermore, it is anticipated that the patient will be medically stable for discharge from the hospital within 2 midnights of admission.   Author: Andris Baumann, MD 05/11/2023 10:27 PM  For on call review [email or web address].

## 2023-05-11 NOTE — Assessment & Plan Note (Signed)
 Renal function at baseline Monitor for worsening history of obstructing ureteral calculus

## 2023-05-11 NOTE — ED Triage Notes (Signed)
 Pt to ED via POV from home. Pt reports LLQ pain that started yesterday and has been getting worse. Pt also reports N/V/D. Pt denies hx of kidney stones. BP in triage 214/119. Pt did not take BP meds this am.

## 2023-05-12 ENCOUNTER — Encounter: Payer: Self-pay | Admitting: Internal Medicine

## 2023-05-12 ENCOUNTER — Observation Stay: Admitting: Anesthesiology

## 2023-05-12 ENCOUNTER — Observation Stay

## 2023-05-12 ENCOUNTER — Encounter
Admission: EM | Disposition: A | Discharge status: Home / Self Care | Payer: Self-pay | Source: Home / Self Care | Attending: Internal Medicine

## 2023-05-12 DIAGNOSIS — G4730 Sleep apnea, unspecified: Secondary | ICD-10-CM | POA: Diagnosis not present

## 2023-05-12 DIAGNOSIS — H9190 Unspecified hearing loss, unspecified ear: Secondary | ICD-10-CM | POA: Diagnosis present

## 2023-05-12 DIAGNOSIS — Z8249 Family history of ischemic heart disease and other diseases of the circulatory system: Secondary | ICD-10-CM | POA: Diagnosis not present

## 2023-05-12 DIAGNOSIS — I160 Hypertensive urgency: Secondary | ICD-10-CM | POA: Diagnosis present

## 2023-05-12 DIAGNOSIS — G4733 Obstructive sleep apnea (adult) (pediatric): Secondary | ICD-10-CM | POA: Diagnosis present

## 2023-05-12 DIAGNOSIS — N1832 Chronic kidney disease, stage 3b: Secondary | ICD-10-CM | POA: Diagnosis present

## 2023-05-12 DIAGNOSIS — N136 Pyonephrosis: Secondary | ICD-10-CM | POA: Diagnosis present

## 2023-05-12 DIAGNOSIS — J45909 Unspecified asthma, uncomplicated: Secondary | ICD-10-CM | POA: Diagnosis present

## 2023-05-12 DIAGNOSIS — N179 Acute kidney failure, unspecified: Secondary | ICD-10-CM | POA: Diagnosis present

## 2023-05-12 DIAGNOSIS — N132 Hydronephrosis with renal and ureteral calculous obstruction: Secondary | ICD-10-CM

## 2023-05-12 DIAGNOSIS — N201 Calculus of ureter: Secondary | ICD-10-CM | POA: Diagnosis not present

## 2023-05-12 DIAGNOSIS — Z9049 Acquired absence of other specified parts of digestive tract: Secondary | ICD-10-CM | POA: Diagnosis not present

## 2023-05-12 DIAGNOSIS — F32A Depression, unspecified: Secondary | ICD-10-CM | POA: Diagnosis present

## 2023-05-12 DIAGNOSIS — F419 Anxiety disorder, unspecified: Secondary | ICD-10-CM | POA: Diagnosis present

## 2023-05-12 DIAGNOSIS — Z6841 Body Mass Index (BMI) 40.0 and over, adult: Secondary | ICD-10-CM | POA: Diagnosis not present

## 2023-05-12 DIAGNOSIS — Z825 Family history of asthma and other chronic lower respiratory diseases: Secondary | ICD-10-CM | POA: Diagnosis not present

## 2023-05-12 DIAGNOSIS — B9620 Unspecified Escherichia coli [E. coli] as the cause of diseases classified elsewhere: Secondary | ICD-10-CM | POA: Diagnosis present

## 2023-05-12 DIAGNOSIS — M1990 Unspecified osteoarthritis, unspecified site: Secondary | ICD-10-CM | POA: Diagnosis present

## 2023-05-12 DIAGNOSIS — I5032 Chronic diastolic (congestive) heart failure: Secondary | ICD-10-CM | POA: Diagnosis present

## 2023-05-12 DIAGNOSIS — N138 Other obstructive and reflux uropathy: Secondary | ICD-10-CM | POA: Diagnosis present

## 2023-05-12 DIAGNOSIS — N3941 Urge incontinence: Secondary | ICD-10-CM | POA: Diagnosis present

## 2023-05-12 DIAGNOSIS — N390 Urinary tract infection, site not specified: Secondary | ICD-10-CM | POA: Diagnosis not present

## 2023-05-12 DIAGNOSIS — Z79899 Other long term (current) drug therapy: Secondary | ICD-10-CM | POA: Diagnosis not present

## 2023-05-12 DIAGNOSIS — G629 Polyneuropathy, unspecified: Secondary | ICD-10-CM | POA: Diagnosis present

## 2023-05-12 DIAGNOSIS — I130 Hypertensive heart and chronic kidney disease with heart failure and stage 1 through stage 4 chronic kidney disease, or unspecified chronic kidney disease: Secondary | ICD-10-CM | POA: Diagnosis present

## 2023-05-12 DIAGNOSIS — Z1152 Encounter for screening for COVID-19: Secondary | ICD-10-CM | POA: Diagnosis not present

## 2023-05-12 DIAGNOSIS — R1032 Left lower quadrant pain: Secondary | ICD-10-CM | POA: Diagnosis present

## 2023-05-12 DIAGNOSIS — E66813 Obesity, class 3: Secondary | ICD-10-CM | POA: Diagnosis present

## 2023-05-12 DIAGNOSIS — Z9221 Personal history of antineoplastic chemotherapy: Secondary | ICD-10-CM | POA: Diagnosis not present

## 2023-05-12 HISTORY — PX: CYSTOSCOPY W/ URETERAL STENT PLACEMENT: SHX1429

## 2023-05-12 LAB — CBC
HCT: 35.2 % — ABNORMAL LOW (ref 36.0–46.0)
Hemoglobin: 12.1 g/dL (ref 12.0–15.0)
MCH: 30.9 pg (ref 26.0–34.0)
MCHC: 34.4 g/dL (ref 30.0–36.0)
MCV: 89.8 fL (ref 80.0–100.0)
Platelets: 193 10*3/uL (ref 150–400)
RBC: 3.92 MIL/uL (ref 3.87–5.11)
RDW: 13.2 % (ref 11.5–15.5)
WBC: 23.1 10*3/uL — ABNORMAL HIGH (ref 4.0–10.5)
nRBC: 0.1 % (ref 0.0–0.2)

## 2023-05-12 LAB — BASIC METABOLIC PANEL WITH GFR
Anion gap: 9 (ref 5–15)
BUN: 30 mg/dL — ABNORMAL HIGH (ref 8–23)
CO2: 22 mmol/L (ref 22–32)
Calcium: 8 mg/dL — ABNORMAL LOW (ref 8.9–10.3)
Chloride: 106 mmol/L (ref 98–111)
Creatinine, Ser: 2.25 mg/dL — ABNORMAL HIGH (ref 0.44–1.00)
GFR, Estimated: 22 mL/min — ABNORMAL LOW (ref >60)
Glucose, Bld: 126 mg/dL — ABNORMAL HIGH (ref 70–99)
Potassium: 2.8 mmol/L — ABNORMAL LOW (ref 3.5–5.1)
Sodium: 137 mmol/L (ref 135–145)

## 2023-05-12 LAB — POCT I-STAT, CHEM 8
BUN: 31 mg/dL — ABNORMAL HIGH (ref 8–23)
Calcium, Ion: 1.06 mmol/L — ABNORMAL LOW (ref 1.15–1.40)
Chloride: 105 mmol/L (ref 98–111)
Creatinine, Ser: 2.6 mg/dL — ABNORMAL HIGH (ref 0.44–1.00)
Glucose, Bld: 94 mg/dL (ref 70–99)
HCT: 40 % (ref 36.0–46.0)
Hemoglobin: 13.6 g/dL (ref 12.0–15.0)
Potassium: 3.4 mmol/L — ABNORMAL LOW (ref 3.5–5.1)
Sodium: 138 mmol/L (ref 135–145)
TCO2: 23 mmol/L (ref 22–32)

## 2023-05-12 SURGERY — CYSTOSCOPY, WITH RETROGRADE PYELOGRAM AND URETERAL STENT INSERTION
Anesthesia: General | Laterality: Left

## 2023-05-12 MED ORDER — POTASSIUM CHLORIDE CRYS ER 20 MEQ PO TBCR
40.0000 meq | EXTENDED_RELEASE_TABLET | Freq: Once | ORAL | Status: AC
  Administered 2023-05-12: 40 meq via ORAL
  Filled 2023-05-12: qty 2

## 2023-05-12 MED ORDER — OXYCODONE HCL 5 MG PO TABS: 5.0000 mg | ORAL_TABLET | Freq: Once | ORAL | Status: DC | PRN

## 2023-05-12 MED ORDER — CHLORHEXIDINE GLUCONATE 0.12 % MT SOLN
15.0000 mL | Freq: Once | OROMUCOSAL | Status: AC
  Administered 2023-05-12: 15 mL via OROMUCOSAL

## 2023-05-12 MED ORDER — CHLORHEXIDINE GLUCONATE 0.12 % MT SOLN
OROMUCOSAL | Status: AC
  Filled 2023-05-12: qty 15

## 2023-05-12 MED ORDER — FENTANYL CITRATE (PF) 100 MCG/2ML IJ SOLN
INTRAMUSCULAR | Status: DC | PRN
  Administered 2023-05-12: 50 ug via INTRAVENOUS

## 2023-05-12 MED ORDER — IOHEXOL 180 MG/ML  SOLN
INTRAMUSCULAR | Status: DC | PRN
  Administered 2023-05-12: 10 mL

## 2023-05-12 MED ORDER — PHENYLEPHRINE 80 MCG/ML (10ML) SYRINGE FOR IV PUSH (FOR BLOOD PRESSURE SUPPORT)
PREFILLED_SYRINGE | INTRAVENOUS | Status: DC | PRN
  Administered 2023-05-12 (×2): 160 ug via INTRAVENOUS
  Administered 2023-05-12: 80 ug via INTRAVENOUS

## 2023-05-12 MED ORDER — FENTANYL CITRATE (PF) 100 MCG/2ML IJ SOLN: 25.0000 ug | INTRAMUSCULAR | Status: DC | PRN

## 2023-05-12 MED ORDER — OXYBUTYNIN CHLORIDE 5 MG PO TABS: 5.0000 mg | ORAL_TABLET | Freq: Three times a day (TID) | ORAL | Status: DC | PRN

## 2023-05-12 MED ORDER — POTASSIUM CHLORIDE 10 MEQ/100ML IV SOLN
10.0000 meq | INTRAVENOUS | Status: AC
  Administered 2023-05-12: 10 meq via INTRAVENOUS
  Filled 2023-05-12: qty 100

## 2023-05-12 MED ORDER — PROPOFOL 500 MG/50ML IV EMUL
INTRAVENOUS | Status: DC | PRN
  Administered 2023-05-12: 150 ug/kg/min via INTRAVENOUS
  Administered 2023-05-12: 40 mg via INTRAVENOUS

## 2023-05-12 MED ORDER — OXYCODONE HCL 5 MG/5ML PO SOLN: 5.0000 mg | Freq: Once | ORAL | Status: DC | PRN

## 2023-05-12 MED ORDER — LACTATED RINGERS IV SOLN: INTRAVENOUS | Status: DC | PRN

## 2023-05-12 MED ORDER — FENTANYL CITRATE (PF) 100 MCG/2ML IJ SOLN
INTRAMUSCULAR | Status: AC
  Filled 2023-05-12: qty 2

## 2023-05-12 SURGICAL SUPPLY — 16 items
BRUSH SCRUB EZ 1% IODOPHOR (MISCELLANEOUS) ×1 IMPLANT
CATH URETL OPEN END 6X70 (CATHETERS) ×1 IMPLANT
GLOVE BIOGEL PI IND STRL 7.5 (GLOVE) ×1 IMPLANT
GOWN STRL REUS W/ TWL LRG LVL3 (GOWN DISPOSABLE) ×1 IMPLANT
GOWN STRL REUS W/ TWL XL LVL3 (GOWN DISPOSABLE) ×1 IMPLANT
GUIDEWIRE STR DUAL SENSOR (WIRE) ×1 IMPLANT
KIT TURNOVER CYSTO (KITS) ×1 IMPLANT
PACK CYSTO AR (MISCELLANEOUS) ×1 IMPLANT
SET CYSTO W/LG BORE CLAMP LF (SET/KITS/TRAYS/PACK) ×1 IMPLANT
SOL .9 NS 3000ML IRR UROMATIC (IV SOLUTION) ×1 IMPLANT
STENT URET 6FRX22 CONTOUR (STENTS) IMPLANT
STENT URET 6FRX24 CONTOUR (STENTS) IMPLANT
STENT URET 6FRX26 CONTOUR (STENTS) IMPLANT
SURGILUBE 2OZ TUBE FLIPTOP (MISCELLANEOUS) ×1 IMPLANT
SYR 10ML LL (SYRINGE) ×1 IMPLANT
WATER STERILE IRR 500ML POUR (IV SOLUTION) ×1 IMPLANT

## 2023-05-12 NOTE — Progress Notes (Signed)
 PHARMACY CONSULT NOTE - ELECTROLYTES  Pharmacy Consult for Electrolyte Monitoring and Replacement   Recent Labs:   CrCl cannot be calculated (Unknown ideal weight.). Potassium (mmol/L)  Date Value  05/12/2023 2.8 (L)   Magnesium (mg/dL)  Date Value  16/11/9602 2.1   Calcium (mg/dL)  Date Value  54/10/8117 8.0 (L)   Albumin (g/dL)  Date Value  14/78/2956 4.2   Phosphorus (mg/dL)  Date Value  21/30/8657 2.4 (L)   Sodium (mmol/L)  Date Value  05/12/2023 137   Assessment  Veronica Graham is a 78 y.o. female presenting with LLQ pain and nausea. PMH significant for breast cancer, CHF, CKD. Pharmacy has been consulted to monitor and replace electrolytes.  Diet: NPO with sips for meds MIVF: N/A Pertinent medications: N/A  Goal of Therapy: Electrolytes WNL  Plan:  K 2.8: Give KCL 40 mEq PO x1 + KCL 10 mEq IV x2 Check BMP, Mg, Phos with AM labs  Thank you for allowing pharmacy to be a part of this patient's care.  Rockwell Alexandria, PharmD Clinical Pharmacist 05/12/2023 10:11 AM

## 2023-05-12 NOTE — Plan of Care (Signed)

## 2023-05-12 NOTE — Interval H&P Note (Signed)
 History and Physical Interval Note:  05/12/2023 1:03 PM  Veronica Graham  has presented today for surgery, with the diagnosis of ureteral calculus.  The various methods of treatment have been discussed with the patient and family. After consideration of risks, benefits and other options for treatment, the patient has consented to  Procedure(s): CYSTOSCOPY, WITH RETROGRADE PYELOGRAM AND URETERAL STENT INSERTION (Left) as a surgical intervention.  The patient's history has been reviewed, patient examined, no change in status, stable for surgery.  I have reviewed the patient's chart and labs.  Questions were answered to the patient's satisfaction.    I discussed the procedure in detail including potential risks of bleeding, infection/sepsis.  We discussed no attempt will be made to remove stone due to the likelihood of bacteremia/sepsis and she will need definitive stone treatment after infection treated.  All questions were answered and she desires to proceed.   Jelena Malicoat C Myka Lukins

## 2023-05-12 NOTE — Progress Notes (Addendum)
 [facility]  -  at [facility]   PATIENT NAME: Veronica Graham    MR#:  [id number]  DATE OF BIRTH:  [birth year]  SUBJECTIVE:   Patient NPO for procedure this afternoon. No family at bedside. Complains of some left lower quadrant pain. No vomiting.   VITALS:  Blood pressure (!) 102/59, pulse 94, temperature 98.2 F (36.8 C), temperature source Oral, resp. rate 18, SpO2 97%.  PHYSICAL EXAMINATION:   GENERAL:  78 y.o.-year-old patient with no acute distress. Morbidly obese LUNGS: Normal breath sounds bilaterally, no wheezing CARDIOVASCULAR: S1, S2 normal. No murmur   ABDOMEN: Soft, left lower quadrant tender, nondistended. Bowel sounds present.  EXTREMITIES: No  edema b/l.    NEUROLOGIC: nonfocal  patient is alert and awake   LABORATORY PANEL:  CBC Recent Labs  Lab 05/12/23 0501  WBC 23.1*  HGB 12.1  HCT 35.2*  PLT 193    Chemistries  Recent Labs  Lab 05/11/23 1637 05/12/23 0501  NA 140 137  K 3.2* 2.8*  CL 106 106  CO2 20* 22  GLUCOSE 188* 126*  BUN 22 30*  CREATININE 1.53* 2.25*  CALCIUM 8.9 8.0*  AST 28  --   ALT 17  --   ALKPHOS 167*  --   BILITOT 1.1  --    Cardiac Enzymes No results for input(s): "TROPONINI" in the last 168 hours. RADIOLOGY:  US PELVIS (TRANSABDOMINAL ONLY) Result Date: 05/11/2023 CLINICAL DATA:  Left lower quadrant pain. Air within the endometrium. EXAM: TRANSABDOMINAL ULTRASOUND OF PELVIS TECHNIQUE: Transabdominal ultrasound examination of the pelvis was performed including evaluation of the uterus, ovaries, adnexal regions, and pelvic cul-de-sac. COMPARISON:  CT today. FINDINGS: Uterus Measurements: 9.1 x 3.5 x 6.3 cm = volume: 104 mL. No fibroids or other mass visualized. Endometrium Thickness: 2 mm in thickness. No focal abnormality visualized. No visible endometrial gas transabdominally. Patient refused transvaginal imaging. Right ovary Measurements: Not visualized.  No adnexal mass seen. Left ovary  Measurements: Not visualized.  No adnexal mass seen. Other findings:  No abnormal free fluid. IMPRESSION: No abnormality visualized on transabdominal imaging. Patient refused transvaginal imaging. Electronically Signed   By: Charlett Nose M.D.   On: 05/11/2023 20:26   CT ABDOMEN PELVIS W CONTRAST Result Date: 05/11/2023 CLINICAL DATA:  Left lower quadrant pain that has progressed since yesterday. EXAM: CT ABDOMEN AND PELVIS WITH CONTRAST TECHNIQUE: Multidetector CT imaging of the abdomen and pelvis was performed using the standard protocol following bolus administration of intravenous contrast. RADIATION DOSE REDUCTION: This exam was performed according to the departmental dose-optimization program which includes automated exposure control, adjustment of the mA and/or kV according to patient size and/or use of iterative reconstruction technique. CONTRAST:  75mL OMNIPAQUE IOHEXOL 300 MG/ML  SOLN COMPARISON:  Noncontrast CT 04/11/2021. FINDINGS: Lower chest: Motion at the lung bases limits evaluation. Mild basilar atelectasis suggested. No pleural effusion. Heart is slightly enlarged. Coronary artery calcifications are seen. Significant calcifications along the mitral valve annulus as well. Slightly patulous esophagus. Hepatobiliary: Previous cholecystectomy. Significant streak artifact from the clips at the hilum. No clear space-occupying liver lesion. Patent portal vein. Pancreas: Severe atrophy of the pancreas, unchanged from previous. No separate mass. Spleen: Normal in size without focal abnormality. Adrenals/Urinary Tract: The adrenal glands are preserved. Moderate atrophy of the right kidney. Mild perinephric stranding right ureter has a normal course and caliber down to the bladder. Preserved contour to the urinary bladder. There is severe perinephric stranding on the left. Global atrophy. Moderate collecting system  dilatation seen diffusely. There is a stone in the distal left ureter. On coronal series 5,  image 70 this measures 8 mm. Stone is seen approximately 3 cm proximal to the UVJ. No additional ureteral stone. There are several small stones in the lower pole left kidney as well. 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 The residual stomach is nondilated. The small bowel is nondilated. Large bowel also has a normal course and caliber. Slightly redundant course of the sigmoid colon. Few colonic diverticula. The appendix is poorly seen in the right lower quadrant. No pericecal stranding or fluid. Vascular/Lymphatic: Aortic atherosclerosis. No enlarged abdominal or pelvic lymph nodes. Reproductive: Uterus is present. No separate adnexal mass. There is some air along the endometrium. Please correlate for any known history or symptomatology. Other: Evaluation limited by motion throughout the examination. Musculoskeletal: Moderate degenerative changes of the spine and pelvis. Curvature of the spine as well. Multilevel stenosis suggested along the lumbar spine. IMPRESSION: 7 mm stone in the distal left ureter approximately 3 cm proximal to the UVJ. Moderate collecting system dilatation above this. Several additional nonobstructing left-sided intrarenal stones. Small amount of air seen along the endometrium. Please correlate with any particular symptoms or history including for infection or recent intervention. Further workup with pelvic ultrasound as clinically appropriate. Surgical changes of prior gastric bypass.  No bowel obstruction. Evaluation limited by motion Electronically Signed   By: Karen Kays M.D.   On: 05/11/2023 17:52    Assessment and Plan   Veronica Graham is a 78 y.o. female with medical history significant for morbid obesity, obstructive sleep apnea not on CPAP, stage IIIa chronic kidney disease, breast cancer status postchemotherapy, diastolic CHF and hypertension, being admitted for renal colic with an obstructing ureteral stone associated with possible UTI.  She presented to  the ED with a 1 day history of left lower quadrant pain, nausea without vomiting.   CT abdomen and pelvis showed a 7 mm distal left ureteral stone with moderate collecting system dilatation above this and several additional nonobstructing left-sided intrarenal stones.   CT abdomen and pelvis also showed small amount of air in the endometrium. Follow-up pelvic ultrasound showed no abnormality.  Patient declined transvaginal imaging. Urology was consulted by ED provider and recommended keep NPO for possible stent placement.  Complicated UTI obstructive neuropathy/left distal ureteral stone with moderate hydronephrosis acute on chronic kidney disease stage IIIB Leukocytosis -- patient received IV fluids -- IV Rocephin -- seen by urology. Appreciate input. Plan for stent placement today -- no fever -- urine culture pending \-- PRN pain meds  Hypertensive urgency -- resume home meds Diovan and Coreg -- PRN hydralazine  Depression with anxiety -- continue home meds  Obesity class III morbid sleep apnea -- CPAP if desired  Chronic congestive heart failure with preserved ejection fraction -- Continue GDMT with carvedilol, ARB and torsemide  -- stable  Procedures: Family communication : none Consults : urology CODE STATUS: full DVT Prophylaxis : SCD for now Level of care: Med-Surg Status is: Observation The patient remains OBS appropriate and will d/c before 2 midnights.    TOTAL TIME TAKING CARE OF THIS PATIENT: 45 minutes.  >50% time spent on counselling and coordination of care  Note: This dictation was prepared with Dragon dictation along with smaller phrase technology. Any transcriptional errors that result from this process are unintentional.  Enedina Finner M.D    [facility]   CC: Primary care physician; Kandyce Rud, MD

## 2023-05-12 NOTE — Consult Note (Signed)
 Urology Consult  I have been asked to see the patient by Dr. Para March, for evaluation and management of ureteral stone.  Chief Complaint: LLQ, left flank pain  History of Present Illness: Veronica Graham is a 78 y.o. year old female with PMH breast cancer, CHF, CKD who presented to the ED yesterday with reports of 1 day of LLQ and left flank pain and nausea.  CTAP with contrast revealed a 7 mm distal left ureteral stone with moderate proximal hydroureteronephrosis.  There are additional nonobstructing left renal stones.  Admission labs notable for creatinine 1.53 (baseline 1.35); white count 17.7; and UA with 21-50 WBCs/hpf and rare bacteria. Add-on urine culture ordered; on antibiotics as below. She was febrile overnight, Tmax 100.12F. BP soft this morning.  Today she reports mild left flank pain. No history of nephrolithiasis, though she does have a family history of nephrolithiasis in her mother.  Anti-infectives (From admission, onward)    Start     Dose/Rate Route Frequency Ordered Stop   05/12/23 1800  cefTRIAXone (ROCEPHIN) 1 g in sodium chloride 0.9 % 100 mL IVPB       Note to Pharmacy: Time first dose 24 from dose in the ED   1 g 200 mL/hr over 30 Minutes Intravenous Every 24 hours 05/11/23 2229     05/11/23 1900  cefTRIAXone (ROCEPHIN) 1 g in sodium chloride 0.9 % 100 mL IVPB        1 g 200 mL/hr over 30 Minutes Intravenous  Once 05/11/23 1851 05/11/23 1950        Past Medical History:  Diagnosis Date   Arthritis    Asthma    Breast cancer (HCC) 2003   right breast   Breast cancer (HCC)    CHF (congestive heart failure) (HCC)    Chronic kidney disease    Depression    Environmental and seasonal allergies    HOH (hard of hearing)    Hypertension    Lower extremity edema    Obesity    Personal history of chemotherapy 2003   Rt. breast   Sinus headache    Sleep apnea    does not use CPAP    Past Surgical History:  Procedure Laterality Date   APPENDECTOMY      BREAST EXCISIONAL BIOPSY Right 2003   positve   BTL     CATARACT EXTRACTION W/PHACO Right 03/31/2017   Procedure: CATARACT EXTRACTION PHACO AND INTRAOCULAR LENS PLACEMENT (IOC);  Surgeon: Galen Manila, MD;  Location: [facility] ORS;  Service: Ophthalmology;  Laterality: Right;  [place] 00:38.2 AP% 18.2 CDE 6.95 Fluid Pcak lot # [id number]   CATARACT EXTRACTION W/PHACO Left 04/22/2017   Procedure: CATARACT EXTRACTION PHACO AND INTRAOCULAR LENS PLACEMENT (IOC);  Surgeon: Galen Manila, MD;  Location: [facility] ORS;  Service: Ophthalmology;  Laterality: Left;  [place] 00:39.0 AP% 12.0 CDE 4.68 Fluid Pack Lot # [id number]   CHOLECYSTECTOMY     COLONOSCOPY N/A 12/18/2021   Procedure: COLONOSCOPY;  Surgeon: Toledo, Boykin Nearing, MD;  Location: [facility] ENDOSCOPY;  Service: Gastroenterology;  Laterality: N/A;   ESOPHAGOGASTRODUODENOSCOPY N/A 12/18/2021   Procedure: ESOPHAGOGASTRODUODENOSCOPY (EGD);  Surgeon: Toledo, Boykin Nearing, MD;  Location: [facility] ENDOSCOPY;  Service: Gastroenterology;  Laterality: N/A;   EYE SURGERY     GASTRIC BYPASS     JOINT REPLACEMENT     MASTECTOMY, PARTIAL     REPLACEMENT TOTAL KNEE BILATERAL     TUBAL LIGATION      Home Medications:  No outpatient medications have been marked  as taking for the 05/11/23 encounter [facility] Encounter).    Allergies:  Allergies  Allergen Reactions   Other Other (See Comments)    DO NOT USE BLOOD PRESSURE CUFF OR NEEDLES (IVs) in RIGHT ARM    Family History  Problem Relation Age of Onset   Heart failure Mother    COPD Father    CAD Brother     Social History:  reports that she has never smoked. She has never used smokeless tobacco. She reports that she does not drink alcohol and does not use drugs.  ROS: A complete review of systems was performed.  All systems are negative except for pertinent findings as noted.  Physical Exam:  Vital signs in last 24 hours: Temp:  [98.2 F (36.8 C)-100.7 F (38.2 C)] 98.2 F (36.8 C) (04/01 0514) Pulse  Rate:  [89-100] 94 (04/01 0514) Resp:  [18-20] 18 (03/31 2101) BP: (102-214)/(59-119) 102/59 (04/01 0514) SpO2:  [95 %-98 %] 97 % (04/01 0514) Constitutional:  Alert and oriented, no acute distress HEENT: [place] AT, moist mucus membranes Cardiovascular: No clubbing, cyanosis, or edema Respiratory: Normal respiratory effort Lymph: No cervical or inguinal adenopathy Neurologic: Grossly intact, no focal deficits, moving all 4 extremities Psychiatric: Normal mood and affect  Laboratory Data:  Recent Labs    05/11/23 1637 05/12/23 0501  WBC 17.7* 23.1*  HGB 14.6 12.1  HCT 44.0 35.2*   Recent Labs    05/11/23 1637 05/12/23 0501  NA 140 137  K 3.2* 2.8*  CL 106 106  CO2 20* 22  GLUCOSE 188* 126*  BUN 22 30*  CREATININE 1.53* 2.25*  CALCIUM 8.9 8.0*   No results for input(s): "LABPT", "INR" in the last 72 hours. No results for input(s): "LABURIN" in the last 72 hours. Urinalysis    Component Value Date/Time   COLORURINE STRAW (A) 05/11/2023 1755   APPEARANCEUR CLEAR (A) 05/11/2023 1755   LABSPEC 1.012 05/11/2023 1755   PHURINE 6.0 05/11/2023 1755   GLUCOSEU 50 (A) 05/11/2023 1755   HGBUR SMALL (A) 05/11/2023 1755   BILIRUBINUR NEGATIVE 05/11/2023 1755   KETONESUR NEGATIVE 05/11/2023 1755   PROTEINUR 100 (A) 05/11/2023 1755   NITRITE NEGATIVE 05/11/2023 1755   LEUKOCYTESUR TRACE (A) 05/11/2023 1755   Results for orders placed or performed during the hospital encounter of 05/11/23  Resp panel by RT-PCR (RSV, Flu A&B, Covid) Anterior Nasal Swab     Status: None   Collection Time: 05/11/23  5:55 PM   Specimen: Anterior Nasal Swab  Result Value Ref Range Status   SARS Coronavirus 2 by RT PCR NEGATIVE NEGATIVE Final    Comment: (NOTE) SARS-CoV-2 target nucleic acids are NOT DETECTED.  The SARS-CoV-2 RNA is generally detectable in upper respiratory specimens during the acute phase of infection. The lowest concentration of SARS-CoV-2 viral copies this assay can detect  is 138 copies/mL. A negative result does not preclude SARS-Cov-2 infection and should not be used as the sole basis for treatment or other patient management decisions. A negative result may occur with  improper specimen collection/handling, submission of specimen other than nasopharyngeal swab, presence of viral mutation(s) within the areas targeted by this assay, and inadequate number of viral copies(<138 copies/mL). A negative result must be combined with clinical observations, patient history, and epidemiological information. The expected result is Negative.  Fact Sheet for Patients:  BloggerCourse.com  Fact Sheet for Healthcare Providers:  SeriousBroker.it  This test is no t yet approved or cleared by the [place] and  has been authorized for detection and/or diagnosis of SARS-CoV-2 by FDA under an Emergency Use Authorization (EUA). This EUA will remain  in effect (meaning this test can be used) for the duration of the COVID-19 declaration under Section 564(b)(1) of the Act, 21 U.S.C.section 360bbb-3(b)(1), unless the authorization is terminated  or revoked sooner.       Influenza A by PCR NEGATIVE NEGATIVE Final   Influenza B by PCR NEGATIVE NEGATIVE Final    Comment: (NOTE) The Xpert Xpress SARS-CoV-2/FLU/RSV plus assay is intended as an aid in the diagnosis of influenza from Nasopharyngeal swab specimens and should not be used as a sole basis for treatment. Nasal washings and aspirates are unacceptable for Xpert Xpress SARS-CoV-2/FLU/RSV testing.  Fact Sheet for Patients: BloggerCourse.com  Fact Sheet for Healthcare Providers: SeriousBroker.it  This test is not yet approved or cleared by the [place] FDA and has been authorized for detection and/or diagnosis of SARS-CoV-2 by FDA under an Emergency Use Authorization (EUA). This EUA will remain in effect  (meaning this test can be used) for the duration of the COVID-19 declaration under Section 564(b)(1) of the Act, 21 U.S.C. section 360bbb-3(b)(1), unless the authorization is terminated or revoked.     Resp Syncytial Virus by PCR NEGATIVE NEGATIVE Final    Comment: (NOTE) Fact Sheet for Patients: BloggerCourse.com  Fact Sheet for Healthcare Providers: SeriousBroker.it  This test is not yet approved or cleared by the [place] FDA and has been authorized for detection and/or diagnosis of SARS-CoV-2 by FDA under an Emergency Use Authorization (EUA). This EUA will remain in effect (meaning this test can be used) for the duration of the COVID-19 declaration under Section 564(b)(1) of the Act, 21 U.S.C. section 360bbb-3(b)(1), unless the authorization is terminated or revoked.  Performed at [facility], [address], [place], [place] [address]     Radiologic Imaging: US PELVIS (TRANSABDOMINAL ONLY) Result Date: 05/11/2023 CLINICAL DATA:  Left lower quadrant pain. Air within the endometrium. EXAM: TRANSABDOMINAL ULTRASOUND OF PELVIS TECHNIQUE: Transabdominal ultrasound examination of the pelvis was performed including evaluation of the uterus, ovaries, adnexal regions, and pelvic cul-de-sac. COMPARISON:  CT today. FINDINGS: Uterus Measurements: 9.1 x 3.5 x 6.3 cm = volume: 104 mL. No fibroids or other mass visualized. Endometrium Thickness: 2 mm in thickness. No focal abnormality visualized. No visible endometrial gas transabdominally. Patient refused transvaginal imaging. Right ovary Measurements: Not visualized.  No adnexal mass seen. Left ovary Measurements: Not visualized.  No adnexal mass seen. Other findings:  No abnormal free fluid. IMPRESSION: No abnormality visualized on transabdominal imaging. Patient refused transvaginal imaging. Electronically Signed   By: Charlett Nose M.D.   On: 05/11/2023 20:26   CT ABDOMEN  PELVIS W CONTRAST Result Date: 05/11/2023 CLINICAL DATA:  Left lower quadrant pain that has progressed since yesterday. EXAM: CT ABDOMEN AND PELVIS WITH CONTRAST TECHNIQUE: Multidetector CT imaging of the abdomen and pelvis was performed using the standard protocol following bolus administration of intravenous contrast. RADIATION DOSE REDUCTION: This exam was performed according to the departmental dose-optimization program which includes automated exposure control, adjustment of the mA and/or kV according to patient size and/or use of iterative reconstruction technique. CONTRAST:  75mL OMNIPAQUE IOHEXOL 300 MG/ML  SOLN COMPARISON:  Noncontrast CT 04/11/2021. FINDINGS: Lower chest: Motion at the lung bases limits evaluation. Mild basilar atelectasis suggested. No pleural effusion. Heart is slightly enlarged. Coronary artery calcifications are seen. Significant calcifications along the mitral valve annulus as well. Slightly patulous esophagus. Hepatobiliary: Previous cholecystectomy. Significant  streak artifact from the clips at the hilum. No clear space-occupying liver lesion. Patent portal vein. Pancreas: Severe atrophy of the pancreas, unchanged from previous. No separate mass. Spleen: Normal in size without focal abnormality. Adrenals/Urinary Tract: The adrenal glands are preserved. Moderate atrophy of the right kidney. Mild perinephric stranding right ureter has a normal course and caliber down to the bladder. Preserved contour to the urinary bladder. There is severe perinephric stranding on the left. Global atrophy. Moderate collecting system dilatation seen diffusely. There is a stone in the distal left ureter. On coronal series 5, image 70 this measures 8 mm. Stone is seen approximately 3 cm proximal to the UVJ. No additional ureteral stone. There are several small stones in the lower pole left kidney as well. 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 The residual stomach is nondilated. The  small bowel is nondilated. Large bowel also has a normal course and caliber. Slightly redundant course of the sigmoid colon. Few colonic diverticula. The appendix is poorly seen in the right lower quadrant. No pericecal stranding or fluid. Vascular/Lymphatic: Aortic atherosclerosis. No enlarged abdominal or pelvic lymph nodes. Reproductive: Uterus is present. No separate adnexal mass. There is some air along the endometrium. Please correlate for any known history or symptomatology. Other: Evaluation limited by motion throughout the examination. Musculoskeletal: Moderate degenerative changes of the spine and pelvis. Curvature of the spine as well. Multilevel stenosis suggested along the lumbar spine. IMPRESSION: 7 mm stone in the distal left ureter approximately 3 cm proximal to the UVJ. Moderate collecting system dilatation above this. Several additional nonobstructing left-sided intrarenal stones. Small amount of air seen along the endometrium. Please correlate with any particular symptoms or history including for infection or recent intervention. Further workup with pelvic ultrasound as clinically appropriate. Surgical changes of prior gastric bypass.  No bowel obstruction. Evaluation limited by motion Electronically Signed   By: Karen Kays M.D.   On: 05/11/2023 17:52   Assessment & Plan:  78 year old female with PMH breast cancer, CHF, and CKD admitted with renal colic secondary to a 7 mm distal left ureteral stone with signs of febrile UTI.  I offered her left ureteral stent placement with Dr. Lonna Cobb today for urinary decompression in the setting of infection.  We discussed that the stone will stay in place and that she will require follow-up ureteroscopy in about 2 to 3 weeks to definitively manage her stone.  We discussed common stent symptoms including flank pain, bladder pain, dysuria, urgency, frequency, and gross hematuria.  We discussed the risks of surgery including infection, bleeding, and  damage to surrounding structures.  She expressed understanding of the above and wishes to proceed.  Recommendations: -Keep n.p.o. in advance of procedure, sips with meds okay.  I also told her she may have a couple of ice chips to moisten her mouth. -Left ureteral stent placement with Dr. Lonna Cobb today; informed consent order placed -Continue antibiotics and follow culture (I just ordered this) for a total of 10 to 14 days of culture appropriate therapy -She will require outpatient left ureteroscopy with laser lithotripsy and stent exchange in about 2 to 3 weeks for definitive management of her stone  Thank you for involving me in this patient's care, I will continue to follow along.  Carman Ching, PA-C 05/12/2023 9:36 AM

## 2023-05-12 NOTE — Transfer of Care (Signed)
 Immediate Anesthesia Transfer of Care Note  Patient: Veronica Graham  Procedure(s) Performed: CYSTOSCOPY, WITH RETROGRADE PYELOGRAM AND URETERAL STENT INSERTION (Left)  Patient Location: PACU  Anesthesia Type:MAC  Level of Consciousness: awake  Airway & Oxygen Therapy: Patient Spontanous Breathing  Post-op Assessment: Report given to RN and Post -op Vital signs reviewed and stable  Post vital signs: Reviewed and stable  Last Vitals:  Vitals Value Taken Time  BP 126/79 05/12/23 1339  Temp 36.7 C 05/12/23 1339  Pulse 87 05/12/23 1343  Resp 18 05/12/23 1343  SpO2 99 % 05/12/23 1343  Vitals shown include unfiled device data.  Last Pain:  Vitals:   05/12/23 1339  TempSrc:   PainSc: 0-No pain      Patients Stated Pain Goal: 0 (05/12/23 0134)  Complications: There were no known notable events for this encounter.

## 2023-05-12 NOTE — H&P (View-Only) (Signed)
 Urology Consult  I have been asked to see the patient by Dr. Para March, for evaluation and management of ureteral stone.  Chief Complaint: LLQ, left flank pain  History of Present Illness: Veronica Graham is a 78 y.o. year old female with PMH breast cancer, CHF, CKD who presented to the ED yesterday with reports of 1 day of LLQ and left flank pain and nausea.  CTAP with contrast revealed a 7 mm distal left ureteral stone with moderate proximal hydroureteronephrosis.  There are additional nonobstructing left renal stones.  Admission labs notable for creatinine 1.53 (baseline 1.35); white count 17.7; and UA with 21-50 WBCs/hpf and rare bacteria. Add-on urine culture ordered; on antibiotics as below. She was febrile overnight, Tmax 100.12F. BP soft this morning.  Today she reports mild left flank pain. No history of nephrolithiasis, though she does have a family history of nephrolithiasis in her mother.  Anti-infectives (From admission, onward)    Start     Dose/Rate Route Frequency Ordered Stop   05/12/23 1800  cefTRIAXone (ROCEPHIN) 1 g in sodium chloride 0.9 % 100 mL IVPB       Note to Pharmacy: Time first dose 24 from dose in the ED   1 g 200 mL/hr over 30 Minutes Intravenous Every 24 hours 05/11/23 2229     05/11/23 1900  cefTRIAXone (ROCEPHIN) 1 g in sodium chloride 0.9 % 100 mL IVPB        1 g 200 mL/hr over 30 Minutes Intravenous  Once 05/11/23 1851 05/11/23 1950        Past Medical History:  Diagnosis Date   Arthritis    Asthma    Breast cancer (HCC) 2003   right breast   Breast cancer (HCC)    CHF (congestive heart failure) (HCC)    Chronic kidney disease    Depression    Environmental and seasonal allergies    HOH (hard of hearing)    Hypertension    Lower extremity edema    Obesity    Personal history of chemotherapy 2003   Rt. breast   Sinus headache    Sleep apnea    does not use CPAP    Past Surgical History:  Procedure Laterality Date   APPENDECTOMY      BREAST EXCISIONAL BIOPSY Right 2003   positve   BTL     CATARACT EXTRACTION W/PHACO Right 03/31/2017   Procedure: CATARACT EXTRACTION PHACO AND INTRAOCULAR LENS PLACEMENT (IOC);  Surgeon: Galen Manila, MD;  Location: [facility];  Service: Ophthalmology;  Laterality: Right;  [place] 00:38.2 AP% 18.2 CDE 6.95 Fluid Pcak lot # [id number]   CATARACT EXTRACTION W/PHACO Left 04/22/2017   Procedure: CATARACT EXTRACTION PHACO AND INTRAOCULAR LENS PLACEMENT (IOC);  Surgeon: Galen Manila, MD;  Location: [facility];  Service: Ophthalmology;  Laterality: Left;  [place] 00:39.0 AP% 12.0 CDE 4.68 Fluid Pack Lot # [id number]   CHOLECYSTECTOMY     COLONOSCOPY N/A 12/18/2021   Procedure: COLONOSCOPY;  Surgeon: Toledo, Boykin Nearing, MD;  Location: [facility];  Service: Gastroenterology;  Laterality: N/A;   ESOPHAGOGASTRODUODENOSCOPY N/A 12/18/2021   Procedure: ESOPHAGOGASTRODUODENOSCOPY (EGD);  Surgeon: Toledo, Boykin Nearing, MD;  Location: [facility];  Service: Gastroenterology;  Laterality: N/A;   EYE SURGERY     GASTRIC BYPASS     JOINT REPLACEMENT     MASTECTOMY, PARTIAL     REPLACEMENT TOTAL KNEE BILATERAL     TUBAL LIGATION      Home Medications:  No outpatient medications have been marked  as taking for the 05/11/23 encounter [facility] Encounter).    Allergies:  Allergies  Allergen Reactions   Other Other (See Comments)    DO NOT USE BLOOD PRESSURE CUFF OR NEEDLES (IVs) in RIGHT ARM    Family History  Problem Relation Age of Onset   Heart failure Mother    COPD Father    CAD Brother     Social History:  reports that she has never smoked. She has never used smokeless tobacco. She reports that she does not drink alcohol and does not use drugs.  ROS: A complete review of systems was performed.  All systems are negative except for pertinent findings as noted.  Physical Exam:  Vital signs in last 24 hours: Temp:  [98.2 F (36.8 C)-100.7 F (38.2 C)] 98.2 F (36.8 C) (04/01 0514) Pulse  Rate:  [89-100] 94 (04/01 0514) Resp:  [18-20] 18 (03/31 2101) BP: (102-214)/(59-119) 102/59 (04/01 0514) SpO2:  [95 %-98 %] 97 % (04/01 0514) Constitutional:  Alert and oriented, no acute distress HEENT: [place] AT, moist mucus membranes Cardiovascular: No clubbing, cyanosis, or edema Respiratory: Normal respiratory effort Lymph: No cervical or inguinal adenopathy Neurologic: Grossly intact, no focal deficits, moving all 4 extremities Psychiatric: Normal mood and affect  Laboratory Data:  Recent Labs    05/11/23 1637 05/12/23 0501  WBC 17.7* 23.1*  HGB 14.6 12.1  HCT 44.0 35.2*   Recent Labs    05/11/23 1637 05/12/23 0501  NA 140 137  K 3.2* 2.8*  CL 106 106  CO2 20* 22  GLUCOSE 188* 126*  BUN 22 30*  CREATININE 1.53* 2.25*  CALCIUM 8.9 8.0*   No results for input(s): "LABPT", "INR" in the last 72 hours. No results for input(s): "LABURIN" in the last 72 hours. Urinalysis    Component Value Date/Time   COLORURINE STRAW (A) 05/11/2023 1755   APPEARANCEUR CLEAR (A) 05/11/2023 1755   LABSPEC 1.012 05/11/2023 1755   PHURINE 6.0 05/11/2023 1755   GLUCOSEU 50 (A) 05/11/2023 1755   HGBUR SMALL (A) 05/11/2023 1755   BILIRUBINUR NEGATIVE 05/11/2023 1755   KETONESUR NEGATIVE 05/11/2023 1755   PROTEINUR 100 (A) 05/11/2023 1755   NITRITE NEGATIVE 05/11/2023 1755   LEUKOCYTESUR TRACE (A) 05/11/2023 1755   Results for orders placed or performed during the hospital encounter of 05/11/23  Resp panel by RT-PCR (RSV, Flu A&B, Covid) Anterior Nasal Swab     Status: None   Collection Time: 05/11/23  5:55 PM   Specimen: Anterior Nasal Swab  Result Value Ref Range Status   SARS Coronavirus 2 by RT PCR NEGATIVE NEGATIVE Final    Comment: (NOTE) SARS-CoV-2 target nucleic acids are NOT DETECTED.  The SARS-CoV-2 RNA is generally detectable in upper respiratory specimens during the acute phase of infection. The lowest concentration of SARS-CoV-2 viral copies this assay can detect  is 138 copies/mL. A negative result does not preclude SARS-Cov-2 infection and should not be used as the sole basis for treatment or other patient management decisions. A negative result may occur with  improper specimen collection/handling, submission of specimen other than nasopharyngeal swab, presence of viral mutation(s) within the areas targeted by this assay, and inadequate number of viral copies(<138 copies/mL). A negative result must be combined with clinical observations, patient history, and epidemiological information. The expected result is Negative.  Fact Sheet for Patients:  BloggerCourse.com  Fact Sheet for Healthcare Providers:  SeriousBroker.it  This test is no t yet approved or cleared by the [place] and  has been authorized for detection and/or diagnosis of SARS-CoV-2 by FDA under an Emergency Use Authorization (EUA). This EUA will remain  in effect (meaning this test can be used) for the duration of the COVID-19 declaration under Section 564(b)(1) of the Act, 21 U.S.C.section 360bbb-3(b)(1), unless the authorization is terminated  or revoked sooner.       Influenza A by PCR NEGATIVE NEGATIVE Final   Influenza B by PCR NEGATIVE NEGATIVE Final    Comment: (NOTE) The Xpert Xpress SARS-CoV-2/FLU/RSV plus assay is intended as an aid in the diagnosis of influenza from Nasopharyngeal swab specimens and should not be used as a sole basis for treatment. Nasal washings and aspirates are unacceptable for Xpert Xpress SARS-CoV-2/FLU/RSV testing.  Fact Sheet for Patients: BloggerCourse.com  Fact Sheet for Healthcare Providers: SeriousBroker.it  This test is not yet approved or cleared by the [place] FDA and has been authorized for detection and/or diagnosis of SARS-CoV-2 by FDA under an Emergency Use Authorization (EUA). This EUA will remain in effect  (meaning this test can be used) for the duration of the COVID-19 declaration under Section 564(b)(1) of the Act, 21 U.S.C. section 360bbb-3(b)(1), unless the authorization is terminated or revoked.     Resp Syncytial Virus by PCR NEGATIVE NEGATIVE Final    Comment: (NOTE) Fact Sheet for Patients: BloggerCourse.com  Fact Sheet for Healthcare Providers: SeriousBroker.it  This test is not yet approved or cleared by the [place] FDA and has been authorized for detection and/or diagnosis of SARS-CoV-2 by FDA under an Emergency Use Authorization (EUA). This EUA will remain in effect (meaning this test can be used) for the duration of the COVID-19 declaration under Section 564(b)(1) of the Act, 21 U.S.C. section 360bbb-3(b)(1), unless the authorization is terminated or revoked.  Performed at [facility], [address], [place], [place] [address]     Radiologic Imaging: US PELVIS (TRANSABDOMINAL ONLY) Result Date: 05/11/2023 CLINICAL DATA:  Left lower quadrant pain. Air within the endometrium. EXAM: TRANSABDOMINAL ULTRASOUND OF PELVIS TECHNIQUE: Transabdominal ultrasound examination of the pelvis was performed including evaluation of the uterus, ovaries, adnexal regions, and pelvic cul-de-sac. COMPARISON:  CT today. FINDINGS: Uterus Measurements: 9.1 x 3.5 x 6.3 cm = volume: 104 mL. No fibroids or other mass visualized. Endometrium Thickness: 2 mm in thickness. No focal abnormality visualized. No visible endometrial gas transabdominally. Patient refused transvaginal imaging. Right ovary Measurements: Not visualized.  No adnexal mass seen. Left ovary Measurements: Not visualized.  No adnexal mass seen. Other findings:  No abnormal free fluid. IMPRESSION: No abnormality visualized on transabdominal imaging. Patient refused transvaginal imaging. Electronically Signed   By: Charlett Nose M.D.   On: 05/11/2023 20:26   CT ABDOMEN  PELVIS W CONTRAST Result Date: 05/11/2023 CLINICAL DATA:  Left lower quadrant pain that has progressed since yesterday. EXAM: CT ABDOMEN AND PELVIS WITH CONTRAST TECHNIQUE: Multidetector CT imaging of the abdomen and pelvis was performed using the standard protocol following bolus administration of intravenous contrast. RADIATION DOSE REDUCTION: This exam was performed according to the departmental dose-optimization program which includes automated exposure control, adjustment of the mA and/or kV according to patient size and/or use of iterative reconstruction technique. CONTRAST:  75mL OMNIPAQUE IOHEXOL 300 MG/ML  SOLN COMPARISON:  Noncontrast CT 04/11/2021. FINDINGS: Lower chest: Motion at the lung bases limits evaluation. Mild basilar atelectasis suggested. No pleural effusion. Heart is slightly enlarged. Coronary artery calcifications are seen. Significant calcifications along the mitral valve annulus as well. Slightly patulous esophagus. Hepatobiliary: Previous cholecystectomy. Significant  streak artifact from the clips at the hilum. No clear space-occupying liver lesion. Patent portal vein. Pancreas: Severe atrophy of the pancreas, unchanged from previous. No separate mass. Spleen: Normal in size without focal abnormality. Adrenals/Urinary Tract: The adrenal glands are preserved. Moderate atrophy of the right kidney. Mild perinephric stranding right ureter has a normal course and caliber down to the bladder. Preserved contour to the urinary bladder. There is severe perinephric stranding on the left. Global atrophy. Moderate collecting system dilatation seen diffusely. There is a stone in the distal left ureter. On coronal series 5, image 70 this measures 8 mm. Stone is seen approximately 3 cm proximal to the UVJ. No additional ureteral stone. There are several small stones in the lower pole left kidney as well. 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 The residual stomach is nondilated. The  small bowel is nondilated. Large bowel also has a normal course and caliber. Slightly redundant course of the sigmoid colon. Few colonic diverticula. The appendix is poorly seen in the right lower quadrant. No pericecal stranding or fluid. Vascular/Lymphatic: Aortic atherosclerosis. No enlarged abdominal or pelvic lymph nodes. Reproductive: Uterus is present. No separate adnexal mass. There is some air along the endometrium. Please correlate for any known history or symptomatology. Other: Evaluation limited by motion throughout the examination. Musculoskeletal: Moderate degenerative changes of the spine and pelvis. Curvature of the spine as well. Multilevel stenosis suggested along the lumbar spine. IMPRESSION: 7 mm stone in the distal left ureter approximately 3 cm proximal to the UVJ. Moderate collecting system dilatation above this. Several additional nonobstructing left-sided intrarenal stones. Small amount of air seen along the endometrium. Please correlate with any particular symptoms or history including for infection or recent intervention. Further workup with pelvic ultrasound as clinically appropriate. Surgical changes of prior gastric bypass.  No bowel obstruction. Evaluation limited by motion Electronically Signed   By: Karen Kays M.D.   On: 05/11/2023 17:52   Assessment & Plan:  78 year old female with PMH breast cancer, CHF, and CKD admitted with renal colic secondary to a 7 mm distal left ureteral stone with signs of febrile UTI.  I offered her left ureteral stent placement with Dr. Lonna Cobb today for urinary decompression in the setting of infection.  We discussed that the stone will stay in place and that she will require follow-up ureteroscopy in about 2 to 3 weeks to definitively manage her stone.  We discussed common stent symptoms including flank pain, bladder pain, dysuria, urgency, frequency, and gross hematuria.  We discussed the risks of surgery including infection, bleeding, and  damage to surrounding structures.  She expressed understanding of the above and wishes to proceed.  Recommendations: -Keep n.p.o. in advance of procedure, sips with meds okay.  I also told her she may have a couple of ice chips to moisten her mouth. -Left ureteral stent placement with Dr. Lonna Cobb today; informed consent order placed -Continue antibiotics and follow culture (I just ordered this) for a total of 10 to 14 days of culture appropriate therapy -She will require outpatient left ureteroscopy with laser lithotripsy and stent exchange in about 2 to 3 weeks for definitive management of her stone  Thank you for involving me in this patient's care, I will continue to follow along.  Carman Ching, PA-C 05/12/2023 9:36 AM

## 2023-05-12 NOTE — Anesthesia Preprocedure Evaluation (Signed)
 Anesthesia Evaluation  Patient identified by MRN, date of birth, ID band Patient awake    Reviewed: Allergy & Precautions, NPO status , Patient's Chart, lab work & pertinent test results  History of Anesthesia Complications Negative for: history of anesthetic complications  Airway Mallampati: III  TM Distance: <3 FB Neck ROM: full    Dental  (+) Chipped, Poor Dentition, Missing   Pulmonary neg shortness of breath, asthma , sleep apnea    Pulmonary exam normal        Cardiovascular hypertension, +CHF  Normal cardiovascular exam     Neuro/Psych  Headaches PSYCHIATRIC DISORDERS         GI/Hepatic negative GI ROS, Neg liver ROS,,,  Endo/Other  negative endocrine ROS    Renal/GU Renal disease  negative genitourinary   Musculoskeletal   Abdominal   Peds  Hematology negative hematology ROS (+)   Anesthesia Other Findings Patient is NPO appropriate and reports no nausea or vomiting today.  Past Medical History: No date: Arthritis No date: Asthma 2003: Breast cancer (HCC)     Comment:  right breast No date: Breast cancer (HCC) No date: CHF (congestive heart failure) (HCC) No date: Chronic kidney disease No date: Depression No date: Environmental and seasonal allergies No date: HOH (hard of hearing) No date: Hypertension No date: Lower extremity edema No date: Obesity 2003: Personal history of chemotherapy     Comment:  Rt. breast No date: Sinus headache No date: Sleep apnea     Comment:  does not use CPAP  Past Surgical History: No date: APPENDECTOMY 2003: BREAST EXCISIONAL BIOPSY; Right     Comment:  positve No date: BTL 03/31/2017: CATARACT EXTRACTION W/PHACO; Right     Comment:  Procedure: CATARACT EXTRACTION PHACO AND INTRAOCULAR               LENS PLACEMENT (IOC);  Surgeon: Galen Manila, MD;                Location: [facility] OR[facility];  Service: Ophthalmology;  Laterality:              Right;  [place]  00:38.2 AP% 18.2 CDE 6.95 Fluid Pcak lot #               [id number] 04/22/2017: CATARACT EXTRACTION W/PHACO; Left     Comment:  Procedure: CATARACT EXTRACTION PHACO AND INTRAOCULAR               LENS PLACEMENT (IOC);  Surgeon: Galen Manila, MD;                Location: [facility];  Service: Ophthalmology;  Laterality:              Left;  [place] 00:39.0 AP% 12.0 CDE 4.68 Fluid Pack Lot #               [id number] No date: CHOLECYSTECTOMY 12/18/2021: COLONOSCOPY; N/A     Comment:  Procedure: COLONOSCOPY;  Surgeon: Toledo, Boykin Nearing, MD;              Location: [facility];  Service: Gastroenterology;                Laterality: N/A; 12/18/2021: ESOPHAGOGASTRODUODENOSCOPY; N/A     Comment:  Procedure: ESOPHAGOGASTRODUODENOSCOPY (EGD);  Surgeon:               Toledo, Boykin Nearing, MD;  Location: [facility];                Service: Gastroenterology;  Laterality:  N/A; No date: EYE SURGERY No date: GASTRIC BYPASS No date: JOINT REPLACEMENT No date: MASTECTOMY, PARTIAL No date: REPLACEMENT TOTAL KNEE BILATERAL No date: TUBAL LIGATION     Reproductive/Obstetrics negative OB ROS                             Anesthesia Physical Anesthesia Plan  ASA: 3  Anesthesia Plan: General   Post-op Pain Management:    Induction: Intravenous  PONV Risk Score and Plan: Propofol infusion and TIVA  Airway Management Planned: Natural Airway and Nasal Cannula  Additional Equipment:   Intra-op Plan:   Post-operative Plan:   Informed Consent: I have reviewed the patients History and Physical, chart, labs and discussed the procedure including the risks, benefits and alternatives for the proposed anesthesia with the patient or authorized representative who has indicated his/her understanding and acceptance.     Dental Advisory Given  Plan Discussed with: Anesthesiologist, CRNA and Surgeon  Anesthesia Plan Comments: (Patient consented for risks of anesthesia including  but not limited to:  - adverse reactions to medications - risk of airway placement if required - damage to eyes, teeth, lips or other oral mucosa - nerve damage due to positioning  - sore throat or hoarseness - Damage to heart, brain, nerves, lungs, other parts of body or loss of life  Patient voiced understanding and assent.)       Anesthesia Quick Evaluation

## 2023-05-13 ENCOUNTER — Other Ambulatory Visit: Payer: Self-pay | Admitting: Physician Assistant

## 2023-05-13 ENCOUNTER — Encounter: Payer: Self-pay | Admitting: Urology

## 2023-05-13 DIAGNOSIS — N132 Hydronephrosis with renal and ureteral calculous obstruction: Secondary | ICD-10-CM | POA: Diagnosis not present

## 2023-05-13 DIAGNOSIS — G4730 Sleep apnea, unspecified: Secondary | ICD-10-CM | POA: Diagnosis not present

## 2023-05-13 DIAGNOSIS — N201 Calculus of ureter: Secondary | ICD-10-CM

## 2023-05-13 DIAGNOSIS — I5032 Chronic diastolic (congestive) heart failure: Secondary | ICD-10-CM

## 2023-05-13 DIAGNOSIS — I160 Hypertensive urgency: Secondary | ICD-10-CM | POA: Diagnosis not present

## 2023-05-13 DIAGNOSIS — F418 Other specified anxiety disorders: Secondary | ICD-10-CM

## 2023-05-13 DIAGNOSIS — N390 Urinary tract infection, site not specified: Secondary | ICD-10-CM | POA: Diagnosis not present

## 2023-05-13 LAB — CBC WITH DIFFERENTIAL/PLATELET
Abs Immature Granulocytes: 0.04 10*3/uL (ref 0.00–0.07)
Basophils Absolute: 0.1 10*3/uL (ref 0.0–0.1)
Basophils Relative: 1 %
Eosinophils Absolute: 0.1 10*3/uL (ref 0.0–0.5)
Eosinophils Relative: 1 %
HCT: 37.7 % (ref 36.0–46.0)
Hemoglobin: 12.6 g/dL (ref 12.0–15.0)
Immature Granulocytes: 0 %
Lymphocytes Relative: 9 %
Lymphs Abs: 0.8 10*3/uL (ref 0.7–4.0)
MCH: 29.9 pg (ref 26.0–34.0)
MCHC: 33.4 g/dL (ref 30.0–36.0)
MCV: 89.5 fL (ref 80.0–100.0)
Monocytes Absolute: 0.8 10*3/uL (ref 0.1–1.0)
Monocytes Relative: 9 %
Neutro Abs: 7.6 10*3/uL (ref 1.7–7.7)
Neutrophils Relative %: 80 %
Platelets: 185 10*3/uL (ref 150–400)
RBC: 4.21 MIL/uL (ref 3.87–5.11)
RDW: 13.3 % (ref 11.5–15.5)
WBC: 9.5 10*3/uL (ref 4.0–10.5)
nRBC: 0 % (ref 0.0–0.2)

## 2023-05-13 LAB — BASIC METABOLIC PANEL WITH GFR
Anion gap: 10 (ref 5–15)
BUN: 38 mg/dL — ABNORMAL HIGH (ref 8–23)
CO2: 23 mmol/L (ref 22–32)
Calcium: 8.1 mg/dL — ABNORMAL LOW (ref 8.9–10.3)
Chloride: 104 mmol/L (ref 98–111)
Creatinine, Ser: 2.39 mg/dL — ABNORMAL HIGH (ref 0.44–1.00)
GFR, Estimated: 20 mL/min — ABNORMAL LOW (ref >60)
Glucose, Bld: 107 mg/dL — ABNORMAL HIGH (ref 70–99)
Potassium: 3.5 mmol/L (ref 3.5–5.1)
Sodium: 137 mmol/L (ref 135–145)

## 2023-05-13 LAB — URINE CULTURE

## 2023-05-13 LAB — PHOSPHORUS: Phosphorus: 4 mg/dL (ref 2.5–4.6)

## 2023-05-13 LAB — MAGNESIUM: Magnesium: 1.3 mg/dL — ABNORMAL LOW (ref 1.7–2.4)

## 2023-05-13 MED ORDER — LABETALOL HCL 5 MG/ML IV SOLN
20.0000 mg | INTRAVENOUS | Status: DC | PRN
  Administered 2023-05-13: 20 mg via INTRAVENOUS
  Filled 2023-05-13: qty 4

## 2023-05-13 MED ORDER — ALPRAZOLAM 0.5 MG PO TABS
0.5000 mg | ORAL_TABLET | Freq: Two times a day (BID) | ORAL | Status: DC | PRN
  Administered 2023-05-13 (×2): 0.5 mg via ORAL
  Filled 2023-05-13 (×2): qty 1

## 2023-05-13 MED ORDER — MAGNESIUM SULFATE 4 GM/100ML IV SOLN
4.0000 g | Freq: Once | INTRAVENOUS | Status: AC
Start: 2023-05-13 — End: 2023-05-13
  Administered 2023-05-13: 4 g via INTRAVENOUS
  Filled 2023-05-13: qty 100

## 2023-05-13 NOTE — Anesthesia Postprocedure Evaluation (Signed)
 Anesthesia Post Note  Patient: Veronica Graham  Procedure(s) Performed: CYSTOSCOPY, WITH RETROGRADE PYELOGRAM AND URETERAL STENT INSERTION (Left)  Patient location during evaluation: PACU Anesthesia Type: General Level of consciousness: awake and alert Pain management: pain level controlled Vital Signs Assessment: post-procedure vital signs reviewed and stable Respiratory status: spontaneous breathing, nonlabored ventilation, respiratory function stable and patient connected to nasal cannula oxygen Cardiovascular status: blood pressure returned to baseline and stable Postop Assessment: no apparent nausea or vomiting Anesthetic complications: no   There were no known notable events for this encounter.   Last Vitals:  Vitals:   05/13/23 0746 05/13/23 0800  BP: (!) 159/112 (!) 139/91  Pulse: 87   Resp: 20   Temp: 36.8 C   SpO2: 100%     Last Pain:  Vitals:   05/13/23 0938  TempSrc:   PainSc: 2                  Cleda Mccreedy Hassel Uphoff

## 2023-05-13 NOTE — Progress Notes (Signed)
 Urology Inpatient Progress Note  Subjective: No acute events overnight. She had a fever overnight, Tmax 38.4 C, but is afebrile and VSS this morning. WBC count down, 9.5. Creatinine down, 2.39. Urine culture pending; on antibiotics as below. She is spontaneously voiding. Today she reports no pain.  She has been having some urinary incontinence but has not noticed any gross hematuria..  Anti-infectives: Anti-infectives (From admission, onward)    Start     Dose/Rate Route Frequency Ordered Stop   05/12/23 1800  cefTRIAXone (ROCEPHIN) 1 g in sodium chloride 0.9 % 100 mL IVPB       Note to Pharmacy: Time first dose 24 from dose in the ED   1 g 200 mL/hr over 30 Minutes Intravenous Every 24 hours 05/11/23 2229     05/11/23 1900  cefTRIAXone (ROCEPHIN) 1 g in sodium chloride 0.9 % 100 mL IVPB        1 g 200 mL/hr over 30 Minutes Intravenous  Once 05/11/23 1851 05/11/23 1950       Current Facility-Administered Medications  Medication Dose Route Frequency Provider Last Rate Last Admin   acetaminophen (TYLENOL) tablet 650 mg  650 mg Oral Q6H PRN Riki Altes, MD   650 mg at 05/13/23 6045   Or   acetaminophen (TYLENOL) suppository 650 mg  650 mg Rectal Q6H PRN Riki Altes, MD       ALPRAZolam Prudy Feeler) tablet 0.5 mg  0.5 mg Oral BID PRN Andris Baumann, MD   0.5 mg at 05/13/23 0017   cefTRIAXone (ROCEPHIN) 1 g in sodium chloride 0.9 % 100 mL IVPB  1 g Intravenous Q24H Stoioff, Scott C, MD   Stopped at 05/12/23 1847   morphine (PF) 2 MG/ML injection 2 mg  2 mg Intravenous Q2H PRN Irineo Axon C, MD   2 mg at 05/12/23 1819   ondansetron (ZOFRAN) tablet 4 mg  4 mg Oral Q6H PRN Riki Altes, MD       Or   ondansetron (ZOFRAN) injection 4 mg  4 mg Intravenous Q6H PRN Stoioff, Scott C, MD   4 mg at 05/11/23 2214   oxybutynin (DITROPAN) tablet 5 mg  5 mg Oral Q8H PRN Stoioff, Scott C, MD       oxyCODONE (Oxy IR/ROXICODONE) immediate release tablet 5 mg  5 mg Oral Q4H PRN Stoioff,  Scott C, MD   5 mg at 05/12/23 2144   Objective: Vital signs in last 24 hours: Temp:  [98 F (36.7 C)-101.1 F (38.4 C)] 98.2 F (36.8 C) (04/02 0746) Pulse Rate:  [81-106] 87 (04/02 0746) Resp:  [18-20] 20 (04/02 0746) BP: (126-159)/(64-112) 139/91 (04/02 0800) SpO2:  [96 %-100 %] 100 % (04/02 0746)  Intake/Output from previous day: 04/01 0701 - 04/02 0700 In: 677.7 [P.O.:240; I.V.:337.7; IV Piggyback:100] Out: 0  Intake/Output this shift: Total I/O In: 360 [P.O.:360] Out: -   Physical Exam Vitals and nursing note reviewed.  Constitutional:      General: She is not in acute distress.    Appearance: She is not ill-appearing, toxic-appearing or diaphoretic.  HENT:     Head: Normocephalic and atraumatic.  Pulmonary:     Effort: Pulmonary effort is normal. No respiratory distress.  Skin:    General: Skin is warm and dry.  Neurological:     Mental Status: She is alert and oriented to person, place, and time.  Psychiatric:        Mood and Affect: Mood normal.  Behavior: Behavior normal.    Lab Results:  Recent Labs    05/12/23 0501 05/12/23 1144 05/13/23 0902  WBC 23.1*  --  9.5  HGB 12.1 13.6 12.6  HCT 35.2* 40.0 37.7  PLT 193  --  185   BMET Recent Labs    05/12/23 0501 05/12/23 1144 05/13/23 0316  NA 137 138 137  K 2.8* 3.4* 3.5  CL 106 105 104  CO2 22  --  23  GLUCOSE 126* 94 107*  BUN 30* 31* 38*  CREATININE 2.25* 2.60* 2.39*  CALCIUM 8.0*  --  8.1*   Studies/Results: DG OR UROLOGY CYSTO IMAGE ([facility] ONLY) Result Date: 05/12/2023 There is no interpretation for this exam.  This order is for images obtained during a surgical procedure.  Please See "Surgeries" Tab for more information regarding the procedure.   US PELVIS (TRANSABDOMINAL ONLY) Result Date: 05/11/2023 CLINICAL DATA:  Left lower quadrant pain. Air within the endometrium. EXAM: TRANSABDOMINAL ULTRASOUND OF PELVIS TECHNIQUE: Transabdominal ultrasound examination of the pelvis was  performed including evaluation of the uterus, ovaries, adnexal regions, and pelvic cul-de-sac. COMPARISON:  CT today. FINDINGS: Uterus Measurements: 9.1 x 3.5 x 6.3 cm = volume: 104 mL. No fibroids or other mass visualized. Endometrium Thickness: 2 mm in thickness. No focal abnormality visualized. No visible endometrial gas transabdominally. Patient refused transvaginal imaging. Right ovary Measurements: Not visualized.  No adnexal mass seen. Left ovary Measurements: Not visualized.  No adnexal mass seen. Other findings:  No abnormal free fluid. IMPRESSION: No abnormality visualized on transabdominal imaging. Patient refused transvaginal imaging. Electronically Signed   By: Charlett Nose M.D.   On: 05/11/2023 20:26   CT ABDOMEN PELVIS W CONTRAST Result Date: 05/11/2023 CLINICAL DATA:  Left lower quadrant pain that has progressed since yesterday. EXAM: CT ABDOMEN AND PELVIS WITH CONTRAST TECHNIQUE: Multidetector CT imaging of the abdomen and pelvis was performed using the standard protocol following bolus administration of intravenous contrast. RADIATION DOSE REDUCTION: This exam was performed according to the departmental dose-optimization program which includes automated exposure control, adjustment of the mA and/or kV according to patient size and/or use of iterative reconstruction technique. CONTRAST:  75mL OMNIPAQUE IOHEXOL 300 MG/ML  SOLN COMPARISON:  Noncontrast CT 04/11/2021. FINDINGS: Lower chest: Motion at the lung bases limits evaluation. Mild basilar atelectasis suggested. No pleural effusion. Heart is slightly enlarged. Coronary artery calcifications are seen. Significant calcifications along the mitral valve annulus as well. Slightly patulous esophagus. Hepatobiliary: Previous cholecystectomy. Significant streak artifact from the clips at the hilum. No clear space-occupying liver lesion. Patent portal vein. Pancreas: Severe atrophy of the pancreas, unchanged from previous. No separate mass. Spleen:  Normal in size without focal abnormality. Adrenals/Urinary Tract: The adrenal glands are preserved. Moderate atrophy of the right kidney. Mild perinephric stranding right ureter has a normal course and caliber down to the bladder. Preserved contour to the urinary bladder. There is severe perinephric stranding on the left. Global atrophy. Moderate collecting system dilatation seen diffusely. There is a stone in the distal left ureter. On coronal series 5, image 70 this measures 8 mm. Stone is seen approximately 3 cm proximal to the UVJ. No additional ureteral stone. There are several small stones in the lower pole left kidney as well. 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 The residual stomach is nondilated. The small bowel is nondilated. Large bowel also has a normal course and caliber. Slightly redundant course of the sigmoid colon. Few colonic diverticula. The appendix is poorly seen in the right  lower quadrant. No pericecal stranding or fluid. Vascular/Lymphatic: Aortic atherosclerosis. No enlarged abdominal or pelvic lymph nodes. Reproductive: Uterus is present. No separate adnexal mass. There is some air along the endometrium. Please correlate for any known history or symptomatology. Other: Evaluation limited by motion throughout the examination. Musculoskeletal: Moderate degenerative changes of the spine and pelvis. Curvature of the spine as well. Multilevel stenosis suggested along the lumbar spine. IMPRESSION: 7 mm stone in the distal left ureter approximately 3 cm proximal to the UVJ. Moderate collecting system dilatation above this. Several additional nonobstructing left-sided intrarenal stones. Small amount of air seen along the endometrium. Please correlate with any particular symptoms or history including for infection or recent intervention. Further workup with pelvic ultrasound as clinically appropriate. Surgical changes of prior gastric bypass.  No bowel obstruction. Evaluation  limited by motion Electronically Signed   By: Karen Kays M.D.   On: 05/11/2023 17:52   Assessment & Plan: 78 y.o. female with PMH breast cancer, CHF, and CKD now s/p left ureteral stent placement with Dr. Lonna Cobb for management of an obstructing 7 mm distal left ureteral stone with febrile UTI.  She is clinically improving on empiric antibiotics.  She is tolerating her stent well without pain but with some increased urinary incontinence.  We discussed that she will require ~10 days of culture-appropriate antibiotics to treat her infection, followed by outpatient left ureteroscopy with laser lithotripsy and stent exchange in 2-4 weeks to treat her stone. She expressed understanding.  Recommendations: -Continue empiric antibiotics and follow cultures for a total of ~10 days of culture-appropriate therapy. -Continue oxybutynin 5mg  every 8 hours as needed for stent discomfort/bladder spasms/urinary incontinence.  Start Flomax 0.4 mg daily if she develops stent pain. -Outpatient left URS/LL/stent exchange with Dr. Lonna Cobb in 2-4 weeks; our scheduler will contact her after discharge to arrange  Carman Ching, PA-C 05/13/2023

## 2023-05-13 NOTE — Progress Notes (Signed)
 PROGRESS NOTE    GLORENE LEITZKE  YQM:[id number] DOB: [birth year] DOA: 05/11/2023 PCP: Kandyce Rud, MD  Chief Complaint  Patient presents with   Abdominal Pain    Hospital Course:  JALONI SORBER is 78 y.o. female with morbid obesity, obstructive sleep apnea not on CPAP, stage IIIa CKD, breast cancer status post chemotherapy, diastolic CHF, hypertension, who presented with obstructive ureteral stone and UTI.  CT abdomen pelvis revealed 7 mm distal left ureteral stone with moderate collecting system dilation above and several additional nonobstructing left-sided intrarenal stones.  Urology was consulted.  On 4/1 patient underwent cystoscopy with retrograde pyelogram and ureteral stent insertion to the left. CT scan also revealed small amount of air in endometrium.  Patient had follow-up pelvic ultrasound which was without abnormality.  Patient denied transvaginal imaging.  Subjective: Fever 101.1 overnight, she has no complaints this morning.  Reports she is feeling much better and would like to discharge home.   Objective: Vitals:   05/12/23 1750 05/12/23 2022 05/13/23 0427 05/13/23 0746  BP: (!) 149/66 132/65 136/82 (!) 159/112  Pulse: 100 (!) 106 81 87  Resp: 18 19 20 20   Temp: (!) 101.1 F (38.4 C) 99 F (37.2 C) 99 F (37.2 C) 98.2 F (36.8 C)  TempSrc:      SpO2: 97% 96% 98% 100%  Weight:      Height:        Intake/Output Summary (Last 24 hours) at 05/13/2023 0840 Last data filed at 05/13/2023 0303 Gross per 24 hour  Intake 677.74 ml  Output 0 ml  Net 677.74 ml   Filed Weights   05/12/23 1147  Weight: 113.4 kg    Examination: General exam: Appears calm and comfortable, NAD  Respiratory system: No work of breathing, symmetric chest wall expansion Cardiovascular system: S1 & S2 heard, RRR.  Gastrointestinal system: Abdomen is nondistended, soft and nontender.  Neuro: Alert and oriented. No focal neurological deficits. Extremities: Symmetric, expected ROM Skin: No  rashes, lesions Psychiatry: Demonstrates appropriate judgement and insight. Mood & affect appropriate for situation.   Assessment & Plan:  Principal Problem:   Complicated UTI (urinary tract infection) Active Problems:   Ureteral stone with obstruction   Hypertensive urgency   Chronic kidney disease, stage 3b (HCC)   Depression with anxiety   Obesity, Class III, BMI 40-49.9 (morbid obesity) (HCC)   Chronic heart failure with preserved ejection fraction (HFpEF) (HCC)   Sleep apnea    Complicated UTI Obstructive nephropathy Left distal ureteral stone with moderate hydronephrosis Acute on chronic kidney disease stage IIIb - Urology consulted - Status post left-sided stent placement and cystoscopy 4/1 - Continue with ceftriaxone, urine cultures currently with no growth - AKI has not resolved to baseline. - Continue to trend CMP - As needed pain meds - Continue to monitor output  Leukocytosis - WBC 23, presumed secondary to infection as above - Repeat CBC resolved to normal.  Hypertensive urgency - Resume home meds  Depression with anxiety - Resume home meds  Class III obesity BMI 44 Sleep apnea - CPAP offered, patient does not use CPAP at home - Outpatient follow up for lifestyle modification and risk factor management  Chronic congestive heart failure with preserved EF - Continue GDMT - Clinically euvolemic at this time    DVT prophylaxis: SCDS   Code Status: Full Code Family Communication:  Discussed directly with patient Disposition:  Inpatient still hospitalized for AKI, will discharge to home when Cr downtrends to baseline  Consultants:  Treatment Team:  Consulting Physician: Sondra Come, MD  Procedures:    Antimicrobials:  Anti-infectives (From admission, onward)    Start     Dose/Rate Route Frequency Ordered Stop   05/12/23 1800  cefTRIAXone (ROCEPHIN) 1 g in sodium chloride 0.9 % 100 mL IVPB       Note to Pharmacy: Time first dose 24 from  dose in the ED   1 g 200 mL/hr over 30 Minutes Intravenous Every 24 hours 05/11/23 2229     05/11/23 1900  cefTRIAXone (ROCEPHIN) 1 g in sodium chloride 0.9 % 100 mL IVPB        1 g 200 mL/hr over 30 Minutes Intravenous  Once 05/11/23 1851 05/11/23 1950       Data Reviewed: I have personally reviewed following labs and imaging studies CBC: Recent Labs  Lab 05/11/23 1637 05/12/23 0501 05/12/23 1144  WBC 17.7* 23.1*  --   NEUTROABS 15.6*  --   --   HGB 14.6 12.1 13.6  HCT 44.0 35.2* 40.0  MCV 92.1 89.8  --   PLT 260 193  --    Basic Metabolic Panel: Recent Labs  Lab 05/11/23 1637 05/12/23 0501 05/12/23 1144 05/13/23 0316  NA 140 137 138 137  K 3.2* 2.8* 3.4* 3.5  CL 106 106 105 104  CO2 20* 22  --  23  GLUCOSE 188* 126* 94 107*  BUN 22 30* 31* 38*  CREATININE 1.53* 2.25* 2.60* 2.39*  CALCIUM 8.9 8.0*  --  8.1*  MG  --   --   --  1.3*  PHOS  --   --   --  4.0   GFR: Estimated Creatinine Clearance: 23.9 mL/min (A) (by C-G formula based on SCr of 2.39 mg/dL (H)). Liver Function Tests: Recent Labs  Lab 05/11/23 1637  AST 28  ALT 17  ALKPHOS 167*  BILITOT 1.1  PROT 8.1  ALBUMIN 4.2   CBG: No results for input(s): "GLUCAP" in the last 168 hours.  Recent Results (from the past 240 hours)  Resp panel by RT-PCR (RSV, Flu A&B, Covid) Anterior Nasal Swab     Status: None   Collection Time: 05/11/23  5:55 PM   Specimen: Anterior Nasal Swab  Result Value Ref Range Status   SARS Coronavirus 2 by RT PCR NEGATIVE NEGATIVE Final    Comment: (NOTE) SARS-CoV-2 target nucleic acids are NOT DETECTED.  The SARS-CoV-2 RNA is generally detectable in upper respiratory specimens during the acute phase of infection. The lowest concentration of SARS-CoV-2 viral copies this assay can detect is 138 copies/mL. A negative result does not preclude SARS-Cov-2 infection and should not be used as the sole basis for treatment or other patient management decisions. A negative result  may occur with  improper specimen collection/handling, submission of specimen other than nasopharyngeal swab, presence of viral mutation(s) within the areas targeted by this assay, and inadequate number of viral copies(<138 copies/mL). A negative result must be combined with clinical observations, patient history, and epidemiological information. The expected result is Negative.  Fact Sheet for Patients:  BloggerCourse.com  Fact Sheet for Healthcare Providers:  SeriousBroker.it  This test is no t yet approved or cleared by the [place] FDA and  has been authorized for detection and/or diagnosis of SARS-CoV-2 by FDA under an Emergency Use Authorization (EUA). This EUA will remain  in effect (meaning this test can be used) for the duration of the COVID-19 declaration under Section 564(b)(1) of the Act, 21 U.S.C.section  360bbb-3(b)(1), unless the authorization is terminated  or revoked sooner.       Influenza A by PCR NEGATIVE NEGATIVE Final   Influenza B by PCR NEGATIVE NEGATIVE Final    Comment: (NOTE) The Xpert Xpress SARS-CoV-2/FLU/RSV plus assay is intended as an aid in the diagnosis of influenza from Nasopharyngeal swab specimens and should not be used as a sole basis for treatment. Nasal washings and aspirates are unacceptable for Xpert Xpress SARS-CoV-2/FLU/RSV testing.  Fact Sheet for Patients: BloggerCourse.com  Fact Sheet for Healthcare Providers: SeriousBroker.it  This test is not yet approved or cleared by the [place] FDA and has been authorized for detection and/or diagnosis of SARS-CoV-2 by FDA under an Emergency Use Authorization (EUA). This EUA will remain in effect (meaning this test can be used) for the duration of the COVID-19 declaration under Section 564(b)(1) of the Act, 21 U.S.C. section 360bbb-3(b)(1), unless the authorization is terminated  or revoked.     Resp Syncytial Virus by PCR NEGATIVE NEGATIVE Final    Comment: (NOTE) Fact Sheet for Patients: BloggerCourse.com  Fact Sheet for Healthcare Providers: SeriousBroker.it  This test is not yet approved or cleared by the [place] FDA and has been authorized for detection and/or diagnosis of SARS-CoV-2 by FDA under an Emergency Use Authorization (EUA). This EUA will remain in effect (meaning this test can be used) for the duration of the COVID-19 declaration under Section 564(b)(1) of the Act, 21 U.S.C. section 360bbb-3(b)(1), unless the authorization is terminated or revoked.  Performed at [facility], [address] [place], [place] [address]   Urine Culture     Status: None (Preliminary result)   Collection Time: 05/12/23  1:26 PM   Specimen: Urine, Cystoscope  Result Value Ref Range Status   Specimen Description   Final    CYSTOSCOPY LEFT RENAL PELVIS Performed at [facility], [address] [place], [place] [address]    Special Requests   Final    NONE Performed at [facility], [address] [place], [place] [address]    Culture PENDING  Incomplete   Report Status PENDING  Incomplete     Radiology Studies: DG OR UROLOGY CYSTO IMAGE [facility] ONLY) Result Date: 05/12/2023 There is no interpretation for this exam.  This order is for images obtained during a surgical procedure.  Please See "Surgeries" Tab for more information regarding the procedure.   US PELVIS (TRANSABDOMINAL ONLY) Result Date: 05/11/2023 CLINICAL DATA:  Left lower quadrant pain. Air within the endometrium. EXAM: TRANSABDOMINAL ULTRASOUND OF PELVIS TECHNIQUE: Transabdominal ultrasound examination of the pelvis was performed including evaluation of the uterus, ovaries, adnexal regions, and pelvic cul-de-sac. COMPARISON:  CT today. FINDINGS: Uterus Measurements: 9.1 x 3.5 x 6.3 cm = volume: 104 mL. No fibroids  or other mass visualized. Endometrium Thickness: 2 mm in thickness. No focal abnormality visualized. No visible endometrial gas transabdominally. Patient refused transvaginal imaging. Right ovary Measurements: Not visualized.  No adnexal mass seen. Left ovary Measurements: Not visualized.  No adnexal mass seen. Other findings:  No abnormal free fluid. IMPRESSION: No abnormality visualized on transabdominal imaging. Patient refused transvaginal imaging. Electronically Signed   By: Charlett Nose M.D.   On: 05/11/2023 20:26   CT ABDOMEN PELVIS W CONTRAST Result Date: 05/11/2023 CLINICAL DATA:  Left lower quadrant pain that has progressed since yesterday. EXAM: CT ABDOMEN AND PELVIS WITH CONTRAST TECHNIQUE: Multidetector CT imaging of the abdomen and pelvis was performed using the standard protocol following bolus administration of  intravenous contrast. RADIATION DOSE REDUCTION: This exam was performed according to the departmental dose-optimization program which includes automated exposure control, adjustment of the mA and/or kV according to patient size and/or use of iterative reconstruction technique. CONTRAST:  75mL OMNIPAQUE IOHEXOL 300 MG/ML  SOLN COMPARISON:  Noncontrast CT 04/11/2021. FINDINGS: Lower chest: Motion at the lung bases limits evaluation. Mild basilar atelectasis suggested. No pleural effusion. Heart is slightly enlarged. Coronary artery calcifications are seen. Significant calcifications along the mitral valve annulus as well. Slightly patulous esophagus. Hepatobiliary: Previous cholecystectomy. Significant streak artifact from the clips at the hilum. No clear space-occupying liver lesion. Patent portal vein. Pancreas: Severe atrophy of the pancreas, unchanged from previous. No separate mass. Spleen: Normal in size without focal abnormality. Adrenals/Urinary Tract: The adrenal glands are preserved. Moderate atrophy of the right kidney. Mild perinephric stranding right ureter has a normal course  and caliber down to the bladder. Preserved contour to the urinary bladder. There is severe perinephric stranding on the left. Global atrophy. Moderate collecting system dilatation seen diffusely. There is a stone in the distal left ureter. On coronal series 5, image 70 this measures 8 mm. Stone is seen approximately 3 cm proximal to the UVJ. No additional ureteral stone. There are several small stones in the lower pole left kidney as well. 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 The residual stomach is nondilated. The small bowel is nondilated. Large bowel also has a normal course and caliber. Slightly redundant course of the sigmoid colon. Few colonic diverticula. The appendix is poorly seen in the right lower quadrant. No pericecal stranding or fluid. Vascular/Lymphatic: Aortic atherosclerosis. No enlarged abdominal or pelvic lymph nodes. Reproductive: Uterus is present. No separate adnexal mass. There is some air along the endometrium. Please correlate for any known history or symptomatology. Other: Evaluation limited by motion throughout the examination. Musculoskeletal: Moderate degenerative changes of the spine and pelvis. Curvature of the spine as well. Multilevel stenosis suggested along the lumbar spine. IMPRESSION: 7 mm stone in the distal left ureter approximately 3 cm proximal to the UVJ. Moderate collecting system dilatation above this. Several additional nonobstructing left-sided intrarenal stones. Small amount of air seen along the endometrium. Please correlate with any particular symptoms or history including for infection or recent intervention. Further workup with pelvic ultrasound as clinically appropriate. Surgical changes of prior gastric bypass.  No bowel obstruction. Evaluation limited by motion Electronically Signed   By: Karen Kays M.D.   On: 05/11/2023 17:52    Scheduled Meds: Continuous Infusions:  cefTRIAXone (ROCEPHIN)  IV Stopped (05/12/23 1847)   magnesium  sulfate bolus IVPB       LOS: 1 day  MDM: Patient is high risk for one or more organ failure.  They necessitate ongoing hospitalization for continued IV therapies and subsequent lab monitoring. Total time spent interpreting labs and vitals, coordinating care amongst consultants and care team members, directly assessing and discussing care with the patient and/or family: 55 min    Debarah Crape, DO [facility]  To contact the attending physician between 7A-7P please use Epic Chat. To contact the covering physician during after hours 7P-7A, please review Amion.   05/13/2023, 8:40 AM   *This document has been created with the assistance of dictation software. Please excuse typographical errors. *

## 2023-05-13 NOTE — Hospital Course (Signed)
 Veronica Graham is 78 y.o. female with morbid obesity, obstructive sleep apnea not on CPAP, CKD IIIa, breast cancer status post chemotherapy, diastolic CHF, hypertension, who presented with obstructive ureteral stone and UTI.  CT abdomen pelvis revealed 7 mm distal left ureteral stone with moderate collecting system dilation above and several additional nonobstructing left-sided intrarenal stones.  Urology was consulted.  On 4/1 patient underwent cystoscopy with retrograde pyelogram and ureteral stent insertion to the left. CT scan also revealed small amount of air in endometrium.  Patient had follow-up pelvic ultrasound which was without abnormality.  Patient denied transvaginal imaging. Patient did well after stent placement.  Hospital stay was briefly prolonged due to ongoing AKI.  Creatinine continued to downtrend.  Patient reports that she has established outpatient with a nephrologist and will see them in the clinic soon for follow-up.  She will need to continue course of antibiotics to treat E. coli UTI, this was called directly to the pharmacy at discharge. She will follow-up with urology for stent exchange and left ureteroscopy and laser lithotripsy outpatient. On day of discharge I discussed care plan with the patient as well as her daughter-in-law, Wilkie Aye.  They both endorse understanding.  Patient reports she is ready for discharge home.     Complicated UTI Obstructive nephropathy Left distal ureteral stone with moderate hydronephrosis Acute on chronic kidney disease stage IIIa - Urology consulted - Status post left-sided stent placement and cystoscopy 4/1 - Continue with cefdinir for 5 days at DC.  -- Culture with E Coli, some resistance  - AKI has not resolved to baseline. Follow up with establish nephrologist or PCP to monitor. -- Flomax and as needed Norco at discharge.  Encouraged Tylenol firs   Leukocytosis - Secondary to UTI.  Resolved now.  Hypertensive urgency - Resume home  meds   Depression with anxiety - Resume home meds   Class III obesity BMI 44 Sleep apnea - CPAP offered, patient does not use CPAP at home - Outpatient follow up for lifestyle modification and risk factor management   Chronic congestive heart failure with preserved EF - Continue GDMT - Clinically euvolemic at this time

## 2023-05-13 NOTE — Progress Notes (Signed)
 PHARMACY CONSULT NOTE - ELECTROLYTES  Pharmacy Consult for Electrolyte Monitoring and Replacement   Recent Labs: Height: 5\' 3"  (160 cm) Weight: 113.4 kg (250 lb) IBW/kg (Calculated) : 52.4 Estimated Creatinine Clearance: 23.9 mL/min (A) (by C-G formula based on SCr of 2.39 mg/dL (H)). Potassium (mmol/L)  Date Value  05/13/2023 3.5   Magnesium (mg/dL)  Date Value  16/11/9602 1.3 (L)   Calcium (mg/dL)  Date Value  54/10/8117 8.1 (L)   Albumin (g/dL)  Date Value  14/78/2956 4.2   Phosphorus (mg/dL)  Date Value  21/30/8657 4.0   Sodium (mmol/L)  Date Value  05/13/2023 137   Assessment  Veronica Graham is a 78 y.o. female presenting with LLQ pain and nausea. PMH significant for breast cancer, CHF, CKD. Pharmacy has been consulted to monitor and replace electrolytes.  Diet: Regular/heart healthy MIVF: N/A Pertinent medications: N/A  Goal of Therapy: Electrolytes WNL  Plan:  Mg 1.3: Give magnesium sulfate 4g IV x1 Check BMP, Mg with AM labs  Thank you for allowing pharmacy to be a part of this patient's care.  Rockwell Alexandria, PharmD Clinical Pharmacist 05/13/2023 7:31 AM

## 2023-05-13 NOTE — Plan of Care (Signed)
  Problem: Education: Goal: Knowledge of General Education information will improve Description: Including pain rating scale, medication(s)/side effects and non-pharmacologic comfort measures Outcome: Progressing   Problem: Health Behavior/Discharge Planning: Goal: Ability to manage health-related needs will improve Outcome: Progressing   Problem: Clinical Measurements: Goal: Ability to maintain clinical measurements within normal limits will improve Outcome: Progressing Goal: Will remain free from infection Outcome: Progressing Goal: Diagnostic test results will improve Outcome: Progressing Goal: Respiratory complications will improve Outcome: Progressing Goal: Cardiovascular complication will be avoided Outcome: Progressing   Problem: Coping: Goal: Level of anxiety will decrease Outcome: Progressing   Problem: Elimination: Goal: Will not experience complications related to bowel motility Outcome: Progressing Goal: Will not experience complications related to urinary retention Outcome: Progressing   Problem: Safety: Goal: Ability to remain free from injury will improve Outcome: Progressing

## 2023-05-14 ENCOUNTER — Other Ambulatory Visit: Payer: Self-pay

## 2023-05-14 DIAGNOSIS — N390 Urinary tract infection, site not specified: Secondary | ICD-10-CM | POA: Diagnosis not present

## 2023-05-14 DIAGNOSIS — G4730 Sleep apnea, unspecified: Secondary | ICD-10-CM | POA: Diagnosis not present

## 2023-05-14 DIAGNOSIS — N132 Hydronephrosis with renal and ureteral calculous obstruction: Secondary | ICD-10-CM | POA: Diagnosis not present

## 2023-05-14 DIAGNOSIS — I160 Hypertensive urgency: Secondary | ICD-10-CM | POA: Diagnosis not present

## 2023-05-14 LAB — COMPREHENSIVE METABOLIC PANEL WITH GFR
ALT: 16 U/L (ref 0–44)
AST: 19 U/L (ref 15–41)
Albumin: 2.7 g/dL — ABNORMAL LOW (ref 3.5–5.0)
Alkaline Phosphatase: 103 U/L (ref 38–126)
Anion gap: 10 (ref 5–15)
BUN: 36 mg/dL — ABNORMAL HIGH (ref 8–23)
CO2: 22 mmol/L (ref 22–32)
Calcium: 8.5 mg/dL — ABNORMAL LOW (ref 8.9–10.3)
Chloride: 105 mmol/L (ref 98–111)
Creatinine, Ser: 1.79 mg/dL — ABNORMAL HIGH (ref 0.44–1.00)
GFR, Estimated: 29 mL/min — ABNORMAL LOW (ref >60)
Glucose, Bld: 120 mg/dL — ABNORMAL HIGH (ref 70–99)
Potassium: 3.6 mmol/L (ref 3.5–5.1)
Sodium: 137 mmol/L (ref 135–145)
Total Bilirubin: 0.4 mg/dL (ref 0.0–1.2)
Total Protein: 5.9 g/dL — ABNORMAL LOW (ref 6.5–8.1)

## 2023-05-14 LAB — URINE CULTURE: Culture: 40000 — AB

## 2023-05-14 LAB — CBC WITH DIFFERENTIAL/PLATELET
Abs Immature Granulocytes: 0.01 10*3/uL (ref 0.00–0.07)
Basophils Absolute: 0 10*3/uL (ref 0.0–0.1)
Basophils Relative: 1 %
Eosinophils Absolute: 0.2 10*3/uL (ref 0.0–0.5)
Eosinophils Relative: 4 %
HCT: 35.4 % — ABNORMAL LOW (ref 36.0–46.0)
Hemoglobin: 12.1 g/dL (ref 12.0–15.0)
Immature Granulocytes: 0 %
Lymphocytes Relative: 21 %
Lymphs Abs: 1.1 10*3/uL (ref 0.7–4.0)
MCH: 30.2 pg (ref 26.0–34.0)
MCHC: 34.2 g/dL (ref 30.0–36.0)
MCV: 88.3 fL (ref 80.0–100.0)
Monocytes Absolute: 0.8 10*3/uL (ref 0.1–1.0)
Monocytes Relative: 14 %
Neutro Abs: 3.2 10*3/uL (ref 1.7–7.7)
Neutrophils Relative %: 60 %
Platelets: 177 10*3/uL (ref 150–400)
RBC: 4.01 MIL/uL (ref 3.87–5.11)
RDW: 13 % (ref 11.5–15.5)
WBC: 5.3 10*3/uL (ref 4.0–10.5)
nRBC: 0 % (ref 0.0–0.2)

## 2023-05-14 LAB — PHOSPHORUS: Phosphorus: 3.8 mg/dL (ref 2.5–4.6)

## 2023-05-14 LAB — MAGNESIUM: Magnesium: 2 mg/dL (ref 1.7–2.4)

## 2023-05-14 MED ORDER — ACETAMINOPHEN-CODEINE 300-30 MG PO TABS
1.0000 | ORAL_TABLET | Freq: Three times a day (TID) | ORAL | 0 refills | Status: AC | PRN
  Filled 2023-05-14: qty 12, 4d supply, fill #0

## 2023-05-14 MED ORDER — CEFDINIR 300 MG PO CAPS: 300.0000 mg | ORAL_CAPSULE | Freq: Two times a day (BID) | ORAL | 0 refills | Status: AC

## 2023-05-14 MED ORDER — TAMSULOSIN HCL 0.4 MG PO CAPS
0.4000 mg | ORAL_CAPSULE | Freq: Every day | ORAL | 0 refills | Status: AC
  Filled 2023-05-14: qty 10, 10d supply, fill #0

## 2023-05-14 NOTE — Discharge Summary (Signed)
 Physician Discharge Summary   Patient: Veronica Graham MRN: [id number] DOB: 02-14-1945  Admit date:     05/11/2023  Discharge date: 05/14/23  Discharge Physician: Debarah Crape   PCP: Kandyce Rud, MD   Recommendations at discharge:   Follow-up with nephrology or PCP within the next week to recheck kidney function and ensure it has returned to your baseline Follow-up with urology for stent exchange and lithotripsy.  Discharge Diagnoses: Principal Problem:   Complicated UTI (urinary tract infection) Active Problems:   Ureteral stone with obstruction   Hypertensive urgency   Chronic kidney disease, stage 3b (HCC)   Depression with anxiety   Obesity, Class III, BMI 40-49.9 (morbid obesity) (HCC)   Chronic heart failure with preserved ejection fraction (HFpEF) (HCC)   Sleep apnea  Resolved Problems:   * No resolved hospital problems. *  Hospital Course: Veronica Graham is 78 y.o. female with morbid obesity, obstructive sleep apnea not on CPAP, CKD IIIa, breast cancer status post chemotherapy, diastolic CHF, hypertension, who presented with obstructive ureteral stone and UTI.  CT abdomen pelvis revealed 7 mm distal left ureteral stone with moderate collecting system dilation above and several additional nonobstructing left-sided intrarenal stones.  Urology was consulted.  On 4/1 patient underwent cystoscopy with retrograde pyelogram and ureteral stent insertion to the left. CT scan also revealed small amount of air in endometrium.  Patient had follow-up pelvic ultrasound which was without abnormality.  Patient denied transvaginal imaging. Patient did well after stent placement.  Hospital stay was briefly prolonged due to ongoing AKI.  Creatinine continued to downtrend.  Patient reports that she has established outpatient with a nephrologist and will see them in the clinic soon for follow-up.  She will need to continue course of antibiotics to treat E. coli UTI, this was called directly to  the pharmacy at discharge. She will follow-up with urology for stent exchange and left ureteroscopy and laser lithotripsy outpatient. On day of discharge I discussed care plan with the patient as well as her daughter-in-law, Wilkie Aye.  They both endorse understanding.  Patient reports she is ready for discharge home.     Complicated UTI Obstructive nephropathy Left distal ureteral stone with moderate hydronephrosis Acute on chronic kidney disease stage IIIa - Urology consulted - Status post left-sided stent placement and cystoscopy 4/1 - Continue with cefdinir for 5 days at DC.  -- Culture with E Coli, some resistance  - AKI has not resolved to baseline. Follow up with establish nephrologist or PCP to monitor. -- Flomax and as needed Norco at discharge.  Encouraged Tylenol firs   Leukocytosis - Secondary to UTI.  Resolved now.  Hypertensive urgency - Resume home meds   Depression with anxiety - Resume home meds   Class III obesity BMI 44 Sleep apnea - CPAP offered, patient does not use CPAP at home - Outpatient follow up for lifestyle modification and risk factor management   Chronic congestive heart failure with preserved EF - Continue GDMT - Clinically euvolemic at this time     Consultants: Urology Procedures performed: Stent placement and cystoscopy 4/1  Disposition: Home Diet recommendation:  Discharge Diet Orders (From admission, onward)     Start     Ordered   05/14/23 0000  Diet general        05/14/23 1328           Discharge Instructions     Ambulatory referral to Urology   Complete by: As directed    Call MD for:  difficulty breathing, headache or visual disturbances   Complete by: As directed    Call MD for:  persistant dizziness or light-headedness   Complete by: As directed    Call MD for:  persistant nausea and vomiting   Complete by: As directed    Call MD for:  severe uncontrolled pain   Complete by: As directed    Call MD for:   temperature >100.4   Complete by: As directed    Diet general   Complete by: As directed    Discharge instructions   Complete by: As directed    Please follow-up with your primary care physician and nephrologist to recheck your kidney function and ensure it has returned to normal.   Please follow-up with urology to check on stent and have removed when they deem appropriate Follow up with your primary care physician to discuss the medication changes during this admission   Increase activity slowly   Complete by: As directed         DISCHARGE MEDICATION: Allergies as of 05/14/2023       Reactions   Other Other (See Comments)   DO NOT USE BLOOD PRESSURE CUFF OR NEEDLES (IVs) in RIGHT ARM        Medication List     PAUSE taking these medications    Farxiga 10 MG Tabs tablet Wait to take this until your doctor or other care provider tells you to start again. Generic drug: dapagliflozin propanediol Take 10 mg by mouth every morning.       STOP taking these medications    calcitRIOL 0.25 MCG capsule Commonly known as: ROCALTROL   citalopram 20 MG tablet Commonly known as: CELEXA   NON FORMULARY   TYLENOL ALLERGY COMPLETE PO   valsartan 160 MG tablet Commonly known as: DIOVAN   venlafaxine XR 150 MG 24 hr capsule Commonly known as: EFFEXOR-XR       TAKE these medications    acetaminophen-codeine 300-30 MG tablet Commonly known as: TYLENOL #3 Take 1 tablet by mouth every 8 (eight) hours as needed for up to 5 days for severe pain (pain score 7-10) (for pain not relieved by tylenol alone).   albuterol 108 (90 Base) MCG/ACT inhaler Commonly known as: VENTOLIN HFA Inhale 2 puffs into the lungs every 4 (four) hours as needed for wheezing or shortness of breath.   ALPRAZolam 0.5 MG tablet Commonly known as: XANAX Take 0.5 mg by mouth 2 (two) times daily as needed for anxiety (pt takes at bedtime each night).   carvedilol 3.125 MG tablet Commonly known as:  COREG Take 3.125 mg by mouth 2 (two) times daily.   cefdinir 300 MG capsule Commonly known as: OMNICEF Take 1 capsule (300 mg total) by mouth 2 (two) times daily for 5 days.   Klor-Con M20 20 MEQ tablet Generic drug: potassium chloride SA TAKE 1 TABLET BY MOUTH EVERY DAY   montelukast 10 MG tablet Commonly known as: SINGULAIR Take 10 mg by mouth at bedtime.   omeprazole 20 MG capsule Commonly known as: PRILOSEC Take 20 mg by mouth 2 (two) times daily.   tamsulosin 0.4 MG Caps capsule Commonly known as: Flomax Take 1 capsule (0.4 mg total) by mouth daily after breakfast for 10 days.   torsemide 20 MG tablet Commonly known as: DEMADEX Take 10 mg by mouth daily.        Follow-up Information     Kandyce Rud, MD Follow up.   Specialty: Family Medicine Why: Hospital  follow up Contact information: [address] [facility] [place] [address] [phone]                Discharge Exam: Filed Weights   05/12/23 1147 05/14/23 0315  Weight: 113.4 kg 110.8 kg   Constitutional:  Normal appearance. Non toxic-appearing.  HENT: Head Normocephalic and atraumatic.  Mucous membranes are moist.  Eyes:  Extraocular intact. Conjunctivae normal. Pupils are equal, round, and reactive to light.  Cardiovascular: Rate and Rhythm: Normal rate and regular rhythm.  Pulmonary: Non labored, symmetric rise of chest wall.  Musculoskeletal:  Normal range of motion.  Skin: warm and dry. not jaundiced.  Neurological: No focal deficit present. alert. Oriented. Psychiatric: Mood and Affect congruent.    Condition at discharge: stable  The results of significant diagnostics from this hospitalization (including imaging, microbiology, ancillary and laboratory) are listed below for reference.   Imaging Studies: DG OR UROLOGY CYSTO IMAGE ([facility] ONLY) Result Date: 05/12/2023 There is no interpretation for this exam.  This order is for images  obtained during a surgical procedure.  Please See "Surgeries" Tab for more information regarding the procedure.   US PELVIS (TRANSABDOMINAL ONLY) Result Date: 05/11/2023 CLINICAL DATA:  Left lower quadrant pain. Air within the endometrium. EXAM: TRANSABDOMINAL ULTRASOUND OF PELVIS TECHNIQUE: Transabdominal ultrasound examination of the pelvis was performed including evaluation of the uterus, ovaries, adnexal regions, and pelvic cul-de-sac. COMPARISON:  CT today. FINDINGS: Uterus Measurements: 9.1 x 3.5 x 6.3 cm = volume: 104 mL. No fibroids or other mass visualized. Endometrium Thickness: 2 mm in thickness. No focal abnormality visualized. No visible endometrial gas transabdominally. Patient refused transvaginal imaging. Right ovary Measurements: Not visualized.  No adnexal mass seen. Left ovary Measurements: Not visualized.  No adnexal mass seen. Other findings:  No abnormal free fluid. IMPRESSION: No abnormality visualized on transabdominal imaging. Patient refused transvaginal imaging. Electronically Signed   By: Charlett Nose M.D.   On: 05/11/2023 20:26   CT ABDOMEN PELVIS W CONTRAST Result Date: 05/11/2023 CLINICAL DATA:  Left lower quadrant pain that has progressed since yesterday. EXAM: CT ABDOMEN AND PELVIS WITH CONTRAST TECHNIQUE: Multidetector CT imaging of the abdomen and pelvis was performed using the standard protocol following bolus administration of intravenous contrast. RADIATION DOSE REDUCTION: This exam was performed according to the departmental dose-optimization program which includes automated exposure control, adjustment of the mA and/or kV according to patient size and/or use of iterative reconstruction technique. CONTRAST:  75mL OMNIPAQUE IOHEXOL 300 MG/ML  SOLN COMPARISON:  Noncontrast CT 04/11/2021. FINDINGS: Lower chest: Motion at the lung bases limits evaluation. Mild basilar atelectasis suggested. No pleural effusion. Heart is slightly enlarged. Coronary artery calcifications are  seen. Significant calcifications along the mitral valve annulus as well. Slightly patulous esophagus. Hepatobiliary: Previous cholecystectomy. Significant streak artifact from the clips at the hilum. No clear space-occupying liver lesion. Patent portal vein. Pancreas: Severe atrophy of the pancreas, unchanged from previous. No separate mass. Spleen: Normal in size without focal abnormality. Adrenals/Urinary Tract: The adrenal glands are preserved. Moderate atrophy of the right kidney. Mild perinephric stranding right ureter has a normal course and caliber down to the bladder. Preserved contour to the urinary bladder. There is severe perinephric stranding on the left. Global atrophy. Moderate collecting system dilatation seen diffusely. There is a stone in the distal left ureter. On coronal series 5, image 70 this measures 8 mm. Stone is seen approximately 3 cm proximal to the UVJ. No additional ureteral stone.  There are several small stones in the lower pole left kidney as well. 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 The residual stomach is nondilated. The small bowel is nondilated. Large bowel also has a normal course and caliber. Slightly redundant course of the sigmoid colon. Few colonic diverticula. The appendix is poorly seen in the right lower quadrant. No pericecal stranding or fluid. Vascular/Lymphatic: Aortic atherosclerosis. No enlarged abdominal or pelvic lymph nodes. Reproductive: Uterus is present. No separate adnexal mass. There is some air along the endometrium. Please correlate for any known history or symptomatology. Other: Evaluation limited by motion throughout the examination. Musculoskeletal: Moderate degenerative changes of the spine and pelvis. Curvature of the spine as well. Multilevel stenosis suggested along the lumbar spine. IMPRESSION: 7 mm stone in the distal left ureter approximately 3 cm proximal to the UVJ. Moderate collecting system dilatation above this. Several  additional nonobstructing left-sided intrarenal stones. Small amount of air seen along the endometrium. Please correlate with any particular symptoms or history including for infection or recent intervention. Further workup with pelvic ultrasound as clinically appropriate. Surgical changes of prior gastric bypass.  No bowel obstruction. Evaluation limited by motion Electronically Signed   By: Karen Kays M.D.   On: 05/11/2023 17:52    Microbiology: Results for orders placed or performed during the hospital encounter of 05/11/23  Resp panel by RT-PCR (RSV, Flu A&B, Covid) Anterior Nasal Swab     Status: None   Collection Time: 05/11/23  5:55 PM   Specimen: Anterior Nasal Swab  Result Value Ref Range Status   SARS Coronavirus 2 by RT PCR NEGATIVE NEGATIVE Final    Comment: (NOTE) SARS-CoV-2 target nucleic acids are NOT DETECTED.  The SARS-CoV-2 RNA is generally detectable in upper respiratory specimens during the acute phase of infection. The lowest concentration of SARS-CoV-2 viral copies this assay can detect is 138 copies/mL. A negative result does not preclude SARS-Cov-2 infection and should not be used as the sole basis for treatment or other patient management decisions. A negative result may occur with  improper specimen collection/handling, submission of specimen other than nasopharyngeal swab, presence of viral mutation(s) within the areas targeted by this assay, and inadequate number of viral copies(<138 copies/mL). A negative result must be combined with clinical observations, patient history, and epidemiological information. The expected result is Negative.  Fact Sheet for Patients:  BloggerCourse.com  Fact Sheet for Healthcare Providers:  SeriousBroker.it  This test is no t yet approved or cleared by the [place] FDA and  has been authorized for detection and/or diagnosis of SARS-CoV-2 by FDA under an Emergency Use  Authorization (EUA). This EUA will remain  in effect (meaning this test can be used) for the duration of the COVID-19 declaration under Section 564(b)(1) of the Act, 21 U.S.C.section 360bbb-3(b)(1), unless the authorization is terminated  or revoked sooner.       Influenza A by PCR NEGATIVE NEGATIVE Final   Influenza B by PCR NEGATIVE NEGATIVE Final    Comment: (NOTE) The Xpert Xpress SARS-CoV-2/FLU/RSV plus assay is intended as an aid in the diagnosis of influenza from Nasopharyngeal swab specimens and should not be used as a sole basis for treatment. Nasal washings and aspirates are unacceptable for Xpert Xpress SARS-CoV-2/FLU/RSV testing.  Fact Sheet for Patients: BloggerCourse.com  Fact Sheet for Healthcare Providers: SeriousBroker.it  This test is not yet approved or cleared by the [place] FDA and has been authorized for detection and/or diagnosis of SARS-CoV-2 by FDA under an Emergency Use  Authorization (EUA). This EUA will remain in effect (meaning this test can be used) for the duration of the COVID-19 declaration under Section 564(b)(1) of the Act, 21 U.S.C. section 360bbb-3(b)(1), unless the authorization is terminated or revoked.     Resp Syncytial Virus by PCR NEGATIVE NEGATIVE Final    Comment: (NOTE) Fact Sheet for Patients: BloggerCourse.com  Fact Sheet for Healthcare Providers: SeriousBroker.it  This test is not yet approved or cleared by the [place] FDA and has been authorized for detection and/or diagnosis of SARS-CoV-2 by FDA under an Emergency Use Authorization (EUA). This EUA will remain in effect (meaning this test can be used) for the duration of the COVID-19 declaration under Section 564(b)(1) of the Act, 21 U.S.C. section 360bbb-3(b)(1), unless the authorization is terminated or revoked.  Performed at [facility], [address] [place], [place] [address]   Urine Culture (for pregnant, neutropenic or urologic patients or patients with an indwelling urinary catheter)     Status: Abnormal   Collection Time: 05/11/23  5:55 PM   Specimen: Urine, Clean Catch  Result Value Ref Range Status   Specimen Description   Final    URINE, CLEAN CATCH Performed at [facility], [address] [place], [place] [address]    Special Requests   Final    NONE Performed at [facility], [address] [place], [place] [address]    Culture   Final    Two isolates with different morphologies were identified as the same organism.The most resistant organism was reported. 50,000 COLONIES/mL ESCHERICHIA COLI    Report Status 05/14/2023 FINAL  Final   Organism ID, Bacteria ESCHERICHIA COLI (A)  Final      Susceptibility   Escherichia coli - MIC*    AMPICILLIN >=32 RESISTANT Resistant     CEFAZOLIN <=4 SENSITIVE Sensitive     CEFEPIME <=0.12 SENSITIVE Sensitive     CEFTRIAXONE <=0.25 SENSITIVE Sensitive     CIPROFLOXACIN >=4 RESISTANT Resistant     GENTAMICIN <=1 SENSITIVE Sensitive     IMIPENEM <=0.25 SENSITIVE Sensitive     NITROFURANTOIN <=16 SENSITIVE Sensitive     TRIMETH/SULFA <=20 SENSITIVE Sensitive     AMPICILLIN/SULBACTAM >=32 RESISTANT Resistant     PIP/TAZO <=4 SENSITIVE Sensitive ug/mL    * 50,000 COLONIES/mL ESCHERICHIA COLI  Urine Culture     Status: None   Collection Time: 05/12/23  1:26 PM   Specimen: Urine, Cystoscope  Result Value Ref Range Status   Specimen Description   Final    CYSTOSCOPY LEFT RENAL PELVIS Performed at [facility], [address] [place], [place] [address]    Special Requests   Final    NONE Performed at [facility], [address] [place], [place] [address]    Culture   Final    NO GROWTH Performed at [facility], [address] [place], [place] [address]    Report Status 05/13/2023 FINAL  Final    Labs: CBC: Recent Labs   Lab 05/11/23 1637 05/12/23 0501 05/12/23 1144 05/13/23 0902 05/14/23 0419  WBC 17.7* 23.1*  --  9.5 5.3  NEUTROABS 15.6*  --   --  7.6 3.2  HGB 14.6 12.1 13.6 12.6 12.1  HCT 44.0 35.2* 40.0 37.7 35.4*  MCV 92.1 89.8  --  89.5 88.3  PLT 260 193  --  185 177   Basic Metabolic Panel: Recent Labs  Lab 05/11/23 1637 05/12/23 0501 05/12/23 1144 05/13/23 0316  05/14/23 0419  NA 140 137 138 137 137  K 3.2* 2.8* 3.4* 3.5 3.6  CL 106 106 105 104 105  CO2 20* 22  --  23 22  GLUCOSE 188* 126* 94 107* 120*  BUN 22 30* 31* 38* 36*  CREATININE 1.53* 2.25* 2.60* 2.39* 1.79*  CALCIUM 8.9 8.0*  --  8.1* 8.5*  MG  --   --   --  1.3* 2.0  PHOS  --   --   --  4.0 3.8   Liver Function Tests: Recent Labs  Lab 05/11/23 1637 05/14/23 0419  AST 28 19  ALT 17 16  ALKPHOS 167* 103  BILITOT 1.1 0.4  PROT 8.1 5.9*  ALBUMIN 4.2 2.7*   CBG: No results for input(s): "GLUCAP" in the last 168 hours.  Discharge time spent: 32 minutes.  Signed: Debarah Crape, DO [facility] 05/14/2023

## 2023-05-14 NOTE — Progress Notes (Signed)
 PHARMACY CONSULT NOTE - ELECTROLYTES  Pharmacy Consult for Electrolyte Monitoring and Replacement   Recent Labs: Height: 5\' 3"  (160 cm) Weight: 110.8 kg (244 lb 4.3 oz) IBW/kg (Calculated) : 52.4 Estimated Creatinine Clearance: 31.5 mL/min (A) (by C-G formula based on SCr of 1.79 mg/dL (H)). Potassium (mmol/L)  Date Value  05/14/2023 3.6   Magnesium (mg/dL)  Date Value  16/11/9602 2.0   Calcium (mg/dL)  Date Value  54/10/8117 8.5 (L)   Albumin (g/dL)  Date Value  14/78/2956 2.7 (L)   Phosphorus (mg/dL)  Date Value  21/30/8657 3.8   Sodium (mmol/L)  Date Value  05/14/2023 137   Assessment  Veronica Graham is a 78 y.o. female presenting with LLQ pain and nausea. PMH significant for breast cancer, CHF, CKD. Pharmacy has been consulted to monitor and replace electrolytes.  Diet: Regular/heart healthy MIVF: N/A Pertinent medications: N/A  Goal of Therapy: Electrolytes WNL  Plan:  No electrolyte replacement indicated for today Check BMP, Mg with AM labs  Thank you for allowing pharmacy to be a part of this patient's care.  Rockwell Alexandria, PharmD Clinical Pharmacist 05/14/2023 7:21 AM

## 2023-05-15 NOTE — TOC Transition Note (Signed)
 Transition of Care [facility]) - Discharge Note   Patient Details  Name: MOSSIE GILDER MRN: [id number] Date of Birth: [birth year]  Transition of Care [facility]) CM/SW Contact:  Cherre Blanc, RN Phone Number: 05/15/2023, 6:41 AM   Clinical Narrative:     Patient is medically clear for dc to home. No TOC needs identified.        Patient Goals and CMS Choice            Discharge Placement                       Discharge Plan and Services Additional resources added to the After Visit Summary for                                       Social Drivers of Health (SDOH) Interventions SDOH Screenings   Food Insecurity: No Food Insecurity (05/12/2023)  Housing: Low Risk  (05/12/2023)  Transportation Needs: No Transportation Needs (05/12/2023)  Utilities: Not At Risk (05/12/2023)  Financial Resource Strain: Medium Risk (02/17/2023)   Received from [facility]  Social Connections: Socially Integrated (05/12/2023)  Tobacco Use: Low Risk  (05/12/2023)  Recent Concern: Tobacco Use - Medium Risk (02/17/2023)   Received from [facility]     Readmission Risk Interventions     No data to display

## 2023-05-18 ENCOUNTER — Other Ambulatory Visit: Payer: Self-pay

## 2023-05-18 ENCOUNTER — Telehealth: Payer: Self-pay

## 2023-05-18 DIAGNOSIS — N201 Calculus of ureter: Secondary | ICD-10-CM

## 2023-05-18 NOTE — Progress Notes (Signed)
 Surgical Physician Order Form [facility] [place]  Dr. Lonna Cobb * Scheduling expectation : 2-4 weeks  *Length of Case:   *Clearance needed: no  *Anticoagulation Instructions: N/A  *Aspirin Instructions: N/A  *Post-op visit Date/Instructions:  TBD  *Diagnosis: Left Ureteral Stone  *Procedure: left  Ureteroscopy w/laser lithotripsy & stent exchange (16109)   Additional orders: N/A  -Admit type: OUTpatient  -Anesthesia: General  -VTE Prophylaxis Standing Order SCD's       Other:   -Standing Lab Orders Per Anesthesia    Lab other: None  -Standing Test orders EKG/Chest x-ray per Anesthesia       Test other:   - Medications:  Ancef 2gm IV  -Other orders:  N/A

## 2023-05-18 NOTE — Op Note (Addendum)
    Preoperative diagnosis:  Left distal ureteral calculus obstruction Urinary tract infection  Postoperative diagnosis:  Same  Procedure:  Cystoscopy Left ureteral stent placement (31F/22 cm) Left retrograde pyelography with interpretation  Surgeon: Lorin Picket C. Geoffrey Mankin, M.D.  Anesthesia: General  Complications: None  Intraoperative findings:  Cystoscopy: Bladder mucosa normal in appearance without erythema, solid or papillary lesions Left retrograde pyelogram:  EBL: Minimal  Specimens: None  Indication: Veronica Graham is a 78 y.o. female with a 7 mm left distal ureteral calculus with moderate hydronephrosis, pyuria and low-grade fever.  Referred to the inpatient consultation note for further details.  She presents for cystoscopy with left ureteral stent placement after reviewing the management options for treatment, he/she elected to proceed with the above surgical procedure(s). We have discussed the potential benefits and risks of the procedure, side effects of the proposed treatment, the likelihood of the patient achieving the goals of the procedure, and any potential problems that might occur during the procedure or recuperation. Informed consent has been obtained.  Description of procedure:  The patient was taken to the operating room and general anesthesia was induced.  The patient was placed in the dorsal lithotomy position, prepped and draped in the usual sterile fashion, and preoperative antibiotics were administered. A preoperative time-out was performed.   A 21 French cystoscope sheath with obturator was lubricated and passed per urethra.  A 30 degree lens was then placed and panendoscopy was performed with findings as described above.  Attention was directed to the left ureteral orifice and a 0.038 Sensor guidewire was then advanced up the ureter into the renal pelvis under fluoroscopic guidance.  A 31F open ended ureteral catheter was then advanced over the guidewire and  advanced to the region of the left renal pelvis.  The guidewire was removed and 10 cc of slightly cloudy urine was aspirated and sent for culture.  Retrograde pyelogram was then performed with findings as described above.  The guidewire was replaced and the ureteral catheter was removed.  A 31F/22 cm Contour ureteral stent was advanced over the guidewire.  The stent was positioned appropriately under fluoroscopic and cystoscopic guidance.  The wire was then removed with an adequate stent curl noted in an upper pole calyx as well as in the bladder.  The bladder was then emptied and the procedure ended.  The patient appeared to tolerate the procedure well and without complications.  After anesthetic reversal the patient was transported to the PACU in stable condition.  Plan: She will be for definitive stone treatment and ~ 2 weeks   Irineo Axon, MD

## 2023-05-18 NOTE — Progress Notes (Signed)
  Phone Number: [phone] for Surgical Coordinator Fax Number: [phone]  REQUEST FOR SURGICAL CLEARANCE       Date: Date: 05/18/23  Faxed to: Dr. Juliann Pares, MD  Surgeon: Dr. Irineo Axon, MD     Date of Surgery: 05/26/2023  Operation: Left Ureteroscopy with Laser Lithotripsy and Stent Exchange   Anesthesia Type: General   Diagnosis: Left Ureteral Stone  Patient Requires:   Cardiac / Vascular Clearance : Yes  Reason: Would like to know if patient is optimized for surgery.   Risk Assessment:    Low   []       Moderate   []     High   []           This patient is optimized for surgery  YES []       NO   []    I recommend further assessment/workup prior to surgery. YES []      NO  []   Appointment scheduled for: _______________________   Further recommendations: ____________________________________     Physician Signature:__________________________________   Printed Name: ________________________________________   Date: _________________

## 2023-05-18 NOTE — Telephone Encounter (Signed)
  Per Dr. Lonna Cobb,  Patient is to be scheduled for Left Ureteroscopy with Laser Lithotripsy and Stent Exchange   Veronica Graham was contacted and possible surgical dates were discussed, Tuesday April 15th, 2025 was agreed upon for surgery.    Patient was directed to call [phone] between 1-3pm the day before surgery to find out surgical arrival time.  Instructions were given not to eat or drink from midnight on the night before surgery and have a driver for the day of surgery. On the surgery day patient was instructed to enter through the Medical Mall entrance of [facility] report the Same Day Surgery desk.   Pre-Admit Testing will be in contact via phone to set up an interview with the anesthesia team to review your history and medications prior to surgery.   Reminder of this information was sent via MyChart to the patient.

## 2023-05-18 NOTE — Progress Notes (Signed)
   [facility] Surgical Posting Form  Surgery Date: Date: 05/26/2023  Surgeon: Dr. Irineo Axon, MD  Inpt ( No  )   Outpt (Yes)   Obs ( No  )   Diagnosis: N20.1 Left Ureteral Stone  -CPT: [phone]  Surgery: Left Ureteroscopy with Laser Lithotripsy and Stent Exchange  Stop Anticoagulations: No  Cardiac/Medical/Pulmonary Clearance needed: no  *Orders entered into EPIC  Date: 05/18/23   *Case booked in [place]  Date: 05/18/23  *Notified pt of Surgery: Date: 05/18/23  PRE-OP UA & CX: no  *Placed into Prior Authorization Work [place] Date: 05/18/23  Assistant/laser/rep:No

## 2023-05-20 ENCOUNTER — Other Ambulatory Visit

## 2023-05-20 ENCOUNTER — Encounter
Admission: RE | Admit: 2023-05-20 | Discharge: 2023-05-20 | Disposition: A | Discharge status: Home / Self Care | Source: Ambulatory Visit | Attending: Urology | Admitting: Urology

## 2023-05-20 DIAGNOSIS — N1832 Chronic kidney disease, stage 3b: Secondary | ICD-10-CM

## 2023-05-20 DIAGNOSIS — I10 Essential (primary) hypertension: Secondary | ICD-10-CM

## 2023-05-20 DIAGNOSIS — Z01810 Encounter for preprocedural cardiovascular examination: Secondary | ICD-10-CM

## 2023-05-20 HISTORY — DX: Chronic kidney disease, stage 3b: N18.32

## 2023-05-20 HISTORY — DX: Personal history of urinary calculi: Z87.442

## 2023-05-20 HISTORY — DX: Lymphedema, not elsewhere classified: I89.0

## 2023-05-20 HISTORY — DX: Urinary tract infection, site not specified: N39.0

## 2023-05-20 HISTORY — DX: Anxiety disorder, unspecified: F41.9

## 2023-05-20 HISTORY — DX: Severe sepsis without septic shock: R65.20

## 2023-05-20 HISTORY — DX: Sepsis, unspecified organism: A41.9

## 2023-05-20 HISTORY — DX: Secondary hyperparathyroidism of renal origin: N25.81

## 2023-05-20 NOTE — Patient Instructions (Signed)
 Your procedure is scheduled on:06-09-23 Tuesday Report to the Registration Desk on the 1st floor of the Medical Mall.Then proceed to the 2nd floor Surgery Desk To find out your arrival time, please call [phone] between 1PM - 3PM on:06-08-23 Monday If your arrival time is 6:00 am, do not arrive before that time as the Medical Mall entrance doors do not open until 6:00 am.  REMEMBER: Instructions that are not followed completely may result in serious medical risk, up to and including death; or upon the discretion of your surgeon and anesthesiologist your surgery may need to be rescheduled.  Do not eat food OR drink any liquids after midnight the night before surgery.  No gum chewing or hard candies.  One week prior to surgery:Stop NOW (05-20-23) Stop Anti-inflammatories (NSAIDS) such as Advil, Aleve, Ibuprofen, Motrin, Naproxen, Naprosyn and Aspirin based products such as Excedrin, Goody's Powder, BC Powder. Stop ANY OVER THE COUNTER supplements until after surgery (Vitamin D3, B Complex, Iron)  You may however, continue to take Tylenol if needed for pain up until the day of surgery.  Continue taking all of your other prescription medications up until the day of surgery.  ON THE DAY OF SURGERY ONLY TAKE THESE MEDICATIONS WITH SIPS OF WATER: -buPROPion (WELLBUTRIN XL)  -carvedilol (COREG)  -omeprazole (PRILOSEC)  -oxybutynin (DITROPAN-XL)  -tamsulosin (FLOMAX)  -venlafaxine XR (EFFEXOR-XR)   Use your beclomethasone (QVAR) and Albuterol Inhaler and bring your Albuterol Inhaler to the hospital  No Alcohol for 24 hours before or after surgery.  No Smoking including e-cigarettes for 24 hours before surgery.  No chewable tobacco products for at least 6 hours before surgery.  No nicotine patches on the day of surgery.  Do not use any "recreational" drugs for at least a week (preferably 2 weeks) before your surgery.  Please be advised that the combination of cocaine and anesthesia may  have negative outcomes, up to and including death. If you test positive for cocaine, your surgery will be cancelled.  On the morning of surgery brush your teeth with toothpaste and water, you may rinse your mouth with mouthwash if you wish. Do not swallow any toothpaste or mouthwash.  Do not wear jewelry, make-up, hairpins, clips or nail polish.  For welded (permanent) jewelry: bracelets, anklets, waist bands, etc.  Please have this removed prior to surgery.  If it is not removed, there is a chance that hospital personnel will need to cut it off on the day of surgery.  Do not wear lotions, powders, or perfumes.   Do not shave body hair from the neck down 48 hours before surgery.  Contact lenses, hearing aids and dentures may not be worn into surgery.  Do not bring valuables to the hospital. [facility] is not responsible for any missing/lost belongings or valuables.   Bring your C-PAP to the hospital  Notify your doctor if there is any change in your medical condition (cold, fever, infection).  Wear comfortable clothing (specific to your surgery type) to the hospital.  After surgery, you can help prevent lung complications by doing breathing exercises.  Take deep breaths and cough every 1-2 hours. Your doctor may order a device called an Incentive Spirometer to help you take deep breaths. When coughing or sneezing, hold a pillow firmly against your incision with both hands. This is called "splinting." Doing this helps protect your incision. It also decreases belly discomfort.  If you are being admitted to the hospital overnight, leave your suitcase in the car. After  surgery it may be brought to your room.  In case of increased patient census, it may be necessary for you, the patient, to continue your postoperative care in the Same Day Surgery department.  If you are being discharged the day of surgery, you will not be allowed to drive home. You will need a responsible individual to  drive you home and stay with you for 24 hours after surgery.   If you are taking public transportation, you will need to have a responsible individual with you.  Please call the Pre-admissions Testing Dept. at [phone] if you have any questions about these instructions.  Surgery Visitation Policy:  Patients having surgery or a procedure may have two visitors.  Children under the age of 48 must have an adult with them who is not the patient.

## 2023-06-01 ENCOUNTER — Inpatient Hospital Stay: Admission: RE | Admit: 2023-06-01 | Source: Ambulatory Visit

## 2023-06-02 ENCOUNTER — Encounter
Admission: RE | Admit: 2023-06-02 | Discharge: 2023-06-02 | Disposition: A | Discharge status: Home / Self Care | Source: Ambulatory Visit | Attending: Urology | Admitting: Urology

## 2023-06-02 ENCOUNTER — Encounter: Payer: Self-pay | Admitting: Urology

## 2023-06-02 DIAGNOSIS — I10 Essential (primary) hypertension: Secondary | ICD-10-CM | POA: Diagnosis not present

## 2023-06-02 DIAGNOSIS — I4891 Unspecified atrial fibrillation: Secondary | ICD-10-CM

## 2023-06-02 DIAGNOSIS — Z01810 Encounter for preprocedural cardiovascular examination: Secondary | ICD-10-CM | POA: Diagnosis present

## 2023-06-02 HISTORY — DX: Unspecified atrial fibrillation: I48.91

## 2023-06-02 NOTE — Progress Notes (Addendum)
 Perioperative Services Pre-Admission/Anesthesia Testing    Date: 06/02/23  Name: Veronica Graham MRN:   [id number]  Re: Plans for surgery; needs cardiovascular evaluation  Planned Surgical Procedure(s):    Case: [id number] Date/Time: 06/09/23 0715   Procedure: CYSTOSCOPY/URETEROSCOPY/HOLMIUM LASER/STENT PLACEMENT (Left)   Anesthesia type: General   Diagnosis: Left ureteral stone [N20.1]   Pre-op diagnosis: Left Ureteral Stone   Location: [facility] OR ROOM 10 / [facility] ORS FOR ANESTHESIA GROUP   Surgeons: [name], MD      Clinical Notes:  Patient is scheduled for the above procedure on 06/09/2023 with Dr. Darlynn Elam, MD.  In preparation for the procedure, patient presented to the PAT clinic on the afternoon of 06/02/2023 for labs and ECG.  In review of her preoperative ECG, patient with presumably new onset atrial fibrillation at a rate of 76 bpm.  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  Today's tracing was compared to previous ECG obtained on 06/14/2022 that showed sinus rhythm with a first-degree AV block at a rate of 65 bpm; IRBBB + LAFB noted.  Patient is already established with cardiology and her last visit with Dr. Beau Bound was on 12/11/2022; notes reviewed.  At that time, patient was complaining of fatigue, increased anxiety, and dyspnea.  She denied chest pain, PND, orthopnea, palpitations, significant peripheral edema, vertiginous symptoms, or presyncope/syncope.  Patient has had fairly recent noninvasive cardiovascular testing, including:  Most recent TTE performed on 05/28/2020 revealed a normal left ventricular systolic function with an EF of 55 to 60%.  There were no regional wall motion abnormalities.  There was mild LVH. Left ventricular diastolic Doppler parameters consistent with pseudonormalization (G2DD).  There was mild left atrial dilatation.  Right ventricular size and function normal with a TAPSE measuring 3.4 cm  (normal range >/= 1.6 cm).  There was moderate  to severe mitral annular calcification with moderate associated mitral valve regurgitation.  Mitral valve mildly stenotic with a mean transvalvular pressure gradient of 4.0 mmHg.   There was mild to moderate aortic valve sclerosis/calcifications present; no aortic valve stenosis.  There was trivial pulmonic valve regurgitation. All remaining transvalvular gradients were noted to be normal providing no evidence suggestive of valvular stenosis. Aorta n does not normal in size with no evidence of ectasia or aneurysmal dilatation.  Most recent myocardial perfusion imaging study was performed on 07/11/2021 revealing a normal left ventricular systolic function with a hyperdynamic LVEF of 71%.  There was no evidence of stress-induced myocardial ischemia or arrhythmia; no scintigraphic evidence of scar. TID ratio = 0.9.  Left ventricular cavity size normal.  There was no significant artifact.  The overall quality of the study was fair.  Study determined to be normal and low risk overall.  Patient denies ever being told that she has had an atrial arrhythmia in the past.  Previous ECG reviewed and I do not see evidence of atrial fibrillation in her history.  With that said, patient does have risk factors for atrial fibrillation including,  Age  HTN  T2DM Obesity (BMI 44 kg/m) OSAH not on nocturnal PAP therapy Mitral valve stenosis (MPG 4.0 mmHg) Hyperparathyroidism  CKD-IIIb  EKG:    Impression and Plan:  Veronica Graham scheduled for the above procedure on 06/09/2023.  Preoperative ECG reveals presumably new onset atrial fibrillation. Calculated CHA2DS2-VASc Score = 7 (age x 2, sex, HFpEF, HTN, vascular disease, T2DM).  In review of her medication reconciliation, it is noted that patient is on low-dose carvedilol  3.125 mg twice  daily.  Prior to patient proceeding with upcoming elective surgery, patient was advised that she would need to see her cardiologist for further evaluation.She was asked to call the  office for an appointment to discuss. No changes are being made to the OR schedule at this time. Discussed with patient that changes may need to be made pending availability of Dr. Beau Bound, MD to see her in clinic prior to her scheduled surgery on 06/09/2023.  Send a copy of this note over to Dr. Beau Bound (cardiology) and to Dr. Cherylene Corrente (urology) to make them aware of this new clinical finding. Will update health history in [facility] in efforts to document and communicate new diagnosis to other members of the patient's interdisciplinary care team.   Renate Caroline, MSN, APRN, FNP-C, CEN [facility]  Perioperative Services Nurse Practitioner Phone: [phone] Fax: [phone] 06/02/23 2:23 PM  NOTE: This note has been prepared using Dragon dictation software. Despite my best ability to proofread, there is always the potential that unintentional transcriptional errors may still occur from this process.

## 2023-06-05 ENCOUNTER — Encounter: Payer: Self-pay | Admitting: Urology

## 2023-06-05 ENCOUNTER — Other Ambulatory Visit: Payer: Self-pay | Admitting: Nurse Practitioner

## 2023-06-05 DIAGNOSIS — I5032 Chronic diastolic (congestive) heart failure: Secondary | ICD-10-CM

## 2023-06-05 DIAGNOSIS — I10 Essential (primary) hypertension: Secondary | ICD-10-CM

## 2023-06-05 DIAGNOSIS — I454 Nonspecific intraventricular block: Secondary | ICD-10-CM

## 2023-06-05 DIAGNOSIS — I4891 Unspecified atrial fibrillation: Secondary | ICD-10-CM

## 2023-06-05 NOTE — Progress Notes (Signed)
 Perioperative / Anesthesia Services  Pre-Admission Testing Clinical Review / Pre-Operative Anesthesia Consult  Date: 06/05/23  Patient Demographics:  Name: Veronica Graham DOB: [birth year] MRN:   [id number]  Planned Surgical Procedure(s):    Case: [id number] Date/Time: 06/09/23 0715   Procedure: CYSTOSCOPY/URETEROSCOPY/HOLMIUM LASER/STENT PLACEMENT (Left)   Anesthesia type: General   Diagnosis: Left ureteral stone [N20.1]   Pre-op diagnosis: Left Ureteral Stone   Location: [facility] OR ROOM 10 / [facility]   Surgeons: [name], MD      NOTE: Available PAT nursing documentation and vital signs have been reviewed. Clinical nursing staff has updated patient's PMH/PSHx, current medication list, and drug allergies/intolerances to ensure comprehensive history available to assist in medical decision making as it pertains to the aforementioned surgical procedure and anticipated anesthetic course. Extensive review of available clinical information personally performed. [place] PMH and PSHx updated with any diagnoses/procedures that  may have been inadvertently omitted during his intake with the pre-admission testing department's nursing staff.  Clinical Discussion:  Veronica Graham is a 78 y.o. female who is submitted for pre-surgical anesthesia review and clearance prior to her undergoing the above procedure. Patient has never been a smoker in the past. Pertinent PMH includes: HFpEF, atrial fibrillation, mitral stenosis, bifascicular block (RBBB + LAFB), LBBB, aortic atherosclerosis, HTN, T2DM, CKD-IV, secondary hyperparathyroidism of renal origin, OSAH (noncompliant with nocturnal PAP therapy), asthma, GERD (on daily H2 blocker), anemia of chronic renal disease, nephrolithiasis, lymphedema, OA, cervical spondylosis, depression, anxiety (on BZO).   Patient is followed by cardiology Beau Bound, MD). She was last seen in the cardiology clinic on 06/05/2023; notes reviewed. At the  time of her clinic visit, patient being seen in follow up consult after we referred her over for new onset atrial fibrillation. Patient reporting that she has been more fatigued. She has had some non-specific chest tightness at rest; not exacerbated by exertion.  Patient with chronic vertiginous symptoms x 1 to 2 years.  Patient denied any chest pain PND, orthopnea, palpitations, significant peripheral edema, weakness, or presyncope/syncope. Patient with a past medical history significant for cardiovascular diagnoses. Documented physical exam was grossly benign, providing no evidence of acute exacerbation and/or decompensation of the patient's known cardiovascular conditions.  Most recent TTE performed on 05/28/2020 revealed a normal left ventricular systolic function with an EF of 55-60%. There was mild  LVH.  There were no regional wall motion abnormalities. Left ventricular diastolic Doppler parameters consistent with pseudonormalization (G2DD). Right ventricular size and function normal with a TAPSE measuring 3.4 cm (normal range >/= 1.6 cm). There is mild left atrial enlargement. There was moderate to severe mitral annular calcification with more associated regurgitation. There was mild mitral valve stenosis with a mean transvalvular gradient of 4.0 mmHg. Aortic valve mildly to moderately sclerotic with no evidence of stenosis. There was mild tricuspid valve regurgitation. Aorta normal in size with no evidence of ectasia or aneurysmal dilatation.  Most recent myocardial perfusion imaging study was performed on 07/11/2021 revealing a normal left ventricular systolic function with a hyperdynamic EF of 71%.  There were no regional wall motion abnormalities.  No artifact or left ventricular cavity size enlargement appreciated on review of imaging. SPECT images demonstrated no evidence of stress-induced myocardial ischemia or arrhythmia; no scintigraphic evidence of scar.  TID ratio = 0.9. Study determined to be  normal and low risk.  As previously mentioned, patient diagnosed with new onset atrial fibrillation during PAT visit on 06/02/2023. CHA2DS2-VASc Score = 7 (age  x 2, sex, HFpEF, HTN, vascular disease, T2DM).  Patient already on rate control intervention using carvedilol .  Cardiology prescribed patient DOAC (apixaban) medication that she will plan to start following upcoming surgery with urology.  Plans are to refer patient to EP for discussions regarding LAA closure ([place]).  Blood pressure well-controlled at 124/76 on currently prescribed beta-blocker (carvedilol ) and ARB (valsartan) therapies.  Patient is not taking any type of lipid-lowering therapies for ASCVD prevention at this time.  T2DM well-controlled on currently prescribed regimen; last HgbA1c was 5.3% when checked on 02/12/2023. In the setting of known cardiovascular diagnoses and concurrent T2DM, patient is taking an SGLT2i (dapagliflozin) for added cardiovascular and renovascular protection.  Functional capacity somewhat limited by patient's fatigue, chest tightness, and chronic vertiginous symptoms.  With that said, patient able to complete all of her ADLs/IADLs without cardiovascular limitation.  Per the DASI, patient is able to achieve in excess of 4 METS of physical activity without experiencing any significant angina/anginal equivalent symptoms. Given her new onset atrial fibrillation, decision was made to repeat noninvasive cardiovascular studies including myocardial perfusion imaging and echocardiogram.  Patient will also undergo long-term cardiac event monitor study with plans for DCCV should predominant atrial fibrillation be noted. Cardiology specifically advised that these tests can be done after patient's upcoming procedure with urology.  No changes were made to her medication regimen.  Patient to follow-up with outpatient cardiology/electrophysiology in approximately 1 month for ongoing evaluation of her newly diagnosed atrial  fibrillation.  Veronica Graham is scheduled for an elective CYSTOSCOPY/URETEROSCOPY/HOLMIUM LASER/STENT PLACEMENT (Left) on 06/09/2023 with Dr. Darlynn Elam, MD.  Given patient's past medical history significant for cardiovascular diagnoses, presurgical cardiac clearance was sought by the PAT team. Per cardiology, "this patient is optimized for surgery and may proceed with the planned procedural course with a LOW risk of significant perioperative cardiovascular complications".  Again, during most recent visit with cardiology, patient was started on renal dosed apixaban therapy.  Given upcoming procedure with urology, patient was instructed to initiate therapy postoperatively.  In review of her medication list, patient is not on any other oral anticoagulation or antiplatelet therapies that would need to be held preoperatively.  Patient denies previous perioperative complications with anesthesia in the past. In review her EMR, it is noted that patient underwent a general anesthetic course here at [facility] ([facility]) in 05/2023 without documented complications.      05/14/2023    7:32 AM 05/14/2023    4:47 AM 05/14/2023    3:15 AM  Vitals with BMI  Weight   244 lbs 4 oz  BMI   43.28  Systolic 138 140   Diastolic 68 75   Pulse 69 76    Providers/Specialists:  NOTE: Primary physician provider listed below. Patient may have been seen by APP or partner within same practice.   PROVIDER ROLE / SPECIALTY LAST Adan Holms, MD Urology (Surgeon) 05/13/2023 - seen during inpatient admission  Nestor Banter, MD Primary Care Provider 05/19/2023  Thais Fill, MD Cardiology 06/04/2023  Gari Junior, MD Nephrology 12/23/2022  Alain Aliment. MD Pulmonary Medicine  12/11/2022   Allergies:   Allergies  Allergen Reactions   Other Other (See Comments)    DO NOT USE BLOOD PRESSURE CUFF OR NEEDLES (IVs) in RIGHT ARM   Current Home Medications:   No current  facility-administered medications for this encounter.    acetaminophen -codeine  (TYLENOL  #3) 300-30 MG tablet   albuterol  (PROVENTIL  HFA;VENTOLIN  HFA) 108 (  90 Base) MCG/ACT inhaler   ALPRAZolam  (XANAX ) 0.5 MG tablet   apixaban (ELIQUIS) 2.5 MG TABS tablet   b complex vitamins capsule   beclomethasone (QVAR) 40 MCG/ACT inhaler   buPROPion (WELLBUTRIN XL) 150 MG 24 hr tablet   calcitRIOL  (ROCALTROL ) 0.25 MCG capsule   carvedilol  (COREG ) 3.125 MG tablet   Cholecalciferol  (VITAMIN D-3 PO)   diphenhydrAMINE (BENADRYL) 25 mg capsule   famotidine (PEPCID) 40 MG tablet   [Paused] FARXIGA 10 MG TABS tablet   ferrous sulfate 325 (65 FE) MG EC tablet   montelukast  (SINGULAIR ) 10 MG tablet   omeprazole (PRILOSEC) 20 MG capsule   oxybutynin  (DITROPAN -XL) 5 MG 24 hr tablet   valsartan (DIOVAN) 160 MG tablet   venlafaxine  XR (EFFEXOR -XR) 150 MG 24 hr capsule   History:    Past Surgical History:  Procedure Laterality Date   APPENDECTOMY     BREAST EXCISIONAL BIOPSY Right 2003   positve   CATARACT EXTRACTION W/PHACO Right 03/31/2017   Procedure: CATARACT EXTRACTION PHACO AND INTRAOCULAR LENS PLACEMENT (IOC);  Surgeon: Clair Crews, MD;  Location: [facility] ORS;  Service: Ophthalmology;  Laterality: Right;  US  00:38.2 AP% 18.2 CDE 6.95 Fluid Pcak lot # [id number]   CATARACT EXTRACTION W/PHACO Left 04/22/2017   Procedure: CATARACT EXTRACTION PHACO AND INTRAOCULAR LENS PLACEMENT (IOC);  Surgeon: Clair Crews, MD;  Location: [facility] ORS;  Service: Ophthalmology;  Laterality: Left;  US  00:39.0 AP% 12.0 CDE 4.68 Fluid Pack Lot # [id number]   CHOLECYSTECTOMY     COLONOSCOPY N/A 12/18/2021   Procedure: COLONOSCOPY;  Surgeon: Toledo, Alphonsus Jeans, MD;  Location: [facility] ENDOSCOPY;  Service: Gastroenterology;  Laterality: N/A;   CYSTOSCOPY W/ URETERAL STENT PLACEMENT Left 05/12/2023   Procedure: CYSTOSCOPY, WITH RETROGRADE PYELOGRAM AND URETERAL STENT INSERTION;  Surgeon: [name], MD;  Location:  [facility] ORS;  Service: Urology;  Laterality: Left;   ESOPHAGOGASTRODUODENOSCOPY N/A 12/18/2021   Procedure: ESOPHAGOGASTRODUODENOSCOPY (EGD);  Surgeon: Toledo, Alphonsus Jeans, MD;  Location: [facility] ENDOSCOPY;  Service: Gastroenterology;  Laterality: N/A;   GASTRIC BYPASS N/A 06/10/2004   JOINT REPLACEMENT     MASTECTOMY, PARTIAL     REPLACEMENT TOTAL KNEE BILATERAL     TUBAL LIGATION Bilateral    Family History  Problem Relation Age of Onset   Heart failure Mother    COPD Father    CAD Brother    Social History   Tobacco Use   Smoking status: Never   Smokeless tobacco: Never  Substance Use Topics   Alcohol use: No   Pertinent Clinical Results:  LABS:  Lab Results  Component Value Date   WBC 5.3 05/14/2023   HGB 12.1 05/14/2023   HCT 35.4 (L) 05/14/2023   MCV 88.3 05/14/2023   PLT 177 05/14/2023   Lab Results  Component Value Date   NA 137 05/14/2023   CL 105 05/14/2023   K 3.6 05/14/2023   CO2 22 05/14/2023   BUN 36 (H) 05/14/2023   CREATININE 1.79 (H) 05/14/2023   GFRNONAA 29 (L) 05/14/2023   CALCIUM  8.5 (L) 05/14/2023   PHOS 3.8 05/14/2023   ALBUMIN 2.7 (L) 05/14/2023   GLUCOSE 120 (H) 05/14/2023    ECG: Date: 06/02/2023  Time ECG obtained: 1420 PM Rate: 76 bpm Rhythm:  atrial fibrillation; ILBBB Axis (leads I and aVF): left Intervals: QRS 118 ms. QTc 447 ms. ST segment and T wave changes: No evidence of acute T wave abnormalities or significant ST segment elevation or depression.  Evidence of a possible, age undetermined, prior  infarct:  No Comparison: SR with 1st degree AV block IRBBB + LAFB   IMAGING / PROCEDURES: MYOCARDIAL PERFUSION IMAGING STUDY (LEXISCAN ) performed on 07/11/2021 Normal left ventricular systolic function with a hyperdynamic LVEF of 71% Normal myocardial thickening and wall motion Left ventricular cavity size normal SPECT images demonstrate homogenous tracer distribution throughout the myocardium TID ratio = 0.9 No evidence of  stress-induced myocardial ischemia or arrhythmia Normal low risk study  TRANSTHORACIC ECHOCARDIOGRAM performed on 05/28/2020 Left ventricular ejection fraction, by estimation, is 55 to 60%. The left ventricle has normal function. The left ventricle has no regional wall motion abnormalities. There is mild left ventricular hypertrophy. Left ventricular diastolic parameters are consistent with Grade II diastolic dysfunction (pseudonormalization).  Right ventricular systolic function is normal. The right ventricular size is normal. Tricuspid regurgitation signal is inadequate for assessing PA pressure.  Left atrial size was mildly dilated.  The mitral valve is abnormal. Moderate mitral valve regurgitation. Mild mitral stenosis. The mean mitral valve gradient is 4.0 mmHg. Moderate to severe mitral annular calcification.  The aortic valve is normal in structure. Aortic valve regurgitation is not visualized. Mild to moderate aortic valve sclerosis/calcification is present, without any evidence of aortic stenosis.   US  PELVIS performed on 05/11/2023 Uterus measurements: 9.1 x 3.5 x 6.3 cm = volume: 104 mL. No fibroids or other mass visualized. Endometrium thickness: 2 mm in thickness. No focal abnormality visualized. No visible endometrial gas transabdominally. Patient refused transvaginal imaging. Right ovary: measurements: Not visualized.  No adnexal mass seen. Left ovary measurements: Not visualized.  No adnexal mass seen. Other findings:  No abnormal free fluid. No abnormality visualized on transabdominal imaging. Patient refused transvaginal imaging.  CT ABDOMEN PELVIS W CONTRAST performed on 05/11/2023 7 mm stone in the distal left ureter approximately 3 cm proximal to the UVJ. Moderate collecting system dilatation above this. Several additional nonobstructing left-sided intrarenal stones. Small amount of air seen along the endometrium. Please correlate with any particular symptoms or history  including for infection or recent intervention. Further workup with pelvic ultrasound as clinically appropriate. Surgical changes of prior gastric bypass.   No bowel obstruction. Evaluation limited by motion  Impression and Plan:  Veronica Graham has been referred for pre-anesthesia review and clearance prior to her undergoing the planned anesthetic and procedural courses. Available labs, pertinent testing, and imaging results were personally reviewed by me in preparation for upcoming operative/procedural course. [facility] medical record has been updated following extensive record review and patient interview with PAT staff.   This patient has been appropriately cleared by cardiology with an overall LOW risk of patient experiencing significant perioperative cardiovascular complications. Based on clinical review performed today (06/05/23), barring any significant acute changes in the patient's overall condition, it is anticipated that she will be able to proceed with the planned surgical intervention. Any acute changes in clinical condition may necessitate her procedure being postponed and/or cancelled. Patient will meet with anesthesia team (MD and/or CRNA) on the day of her procedure for preoperative evaluation/assessment. Questions regarding anesthetic course will be fielded at that time.   Pre-surgical instructions were reviewed with the patient during his PAT appointment, and questions were fielded to satisfaction by PAT clinical staff. She has been instructed on which medications that she will need to hold prior to surgery, as well as the ones that have been deemed safe/appropriate to take on the day of her procedure. As part of the general education provided by PAT, patient made aware both verbally and in  writing, that she would need to abstain from the use of any illegal substances during her perioperative course. She was advised that failure to follow the provided instructions could necessitate  case cancellation or result in serious perioperative complications up to and including death. Patient encouraged to contact PAT and/or her surgeon's office to discuss any questions or concerns that may arise prior to surgery; verbalized understanding.   Renate Caroline, MSN, APRN, FNP-C, CEN [facility]  Perioperative Services Nurse Practitioner Phone: [phone] Fax: [phone] 06/05/23 11:24 AM  NOTE: This note has been prepared using Dragon dictation software. Despite my best ability to proofread, there is always the potential that unintentional transcriptional errors may still occur from this process.

## 2023-06-09 ENCOUNTER — Encounter
Admission: RE | Disposition: A | Discharge status: Home / Self Care | Payer: Self-pay | Source: Home / Self Care | Attending: Urology

## 2023-06-09 ENCOUNTER — Encounter: Payer: Self-pay | Admitting: Urology

## 2023-06-09 ENCOUNTER — Other Ambulatory Visit: Payer: Self-pay

## 2023-06-09 ENCOUNTER — Ambulatory Visit

## 2023-06-09 ENCOUNTER — Ambulatory Visit: Payer: Self-pay | Admitting: Urgent Care

## 2023-06-09 ENCOUNTER — Ambulatory Visit
Admission: RE | Admit: 2023-06-09 | Discharge: 2023-06-09 | Disposition: A | Discharge status: Home / Self Care | Attending: Urology | Admitting: Urology

## 2023-06-09 DIAGNOSIS — I4891 Unspecified atrial fibrillation: Secondary | ICD-10-CM | POA: Diagnosis not present

## 2023-06-09 DIAGNOSIS — Z9884 Bariatric surgery status: Secondary | ICD-10-CM | POA: Diagnosis not present

## 2023-06-09 DIAGNOSIS — Z853 Personal history of malignant neoplasm of breast: Secondary | ICD-10-CM | POA: Insufficient documentation

## 2023-06-09 DIAGNOSIS — E1122 Type 2 diabetes mellitus with diabetic chronic kidney disease: Secondary | ICD-10-CM | POA: Diagnosis not present

## 2023-06-09 DIAGNOSIS — I052 Rheumatic mitral stenosis with insufficiency: Secondary | ICD-10-CM | POA: Diagnosis not present

## 2023-06-09 DIAGNOSIS — N201 Calculus of ureter: Secondary | ICD-10-CM

## 2023-06-09 DIAGNOSIS — I700 Atherosclerosis of aorta: Secondary | ICD-10-CM | POA: Diagnosis not present

## 2023-06-09 DIAGNOSIS — Z7951 Long term (current) use of inhaled steroids: Secondary | ICD-10-CM | POA: Diagnosis not present

## 2023-06-09 DIAGNOSIS — I130 Hypertensive heart and chronic kidney disease with heart failure and stage 1 through stage 4 chronic kidney disease, or unspecified chronic kidney disease: Secondary | ICD-10-CM | POA: Diagnosis not present

## 2023-06-09 DIAGNOSIS — E669 Obesity, unspecified: Secondary | ICD-10-CM | POA: Diagnosis not present

## 2023-06-09 DIAGNOSIS — I509 Heart failure, unspecified: Secondary | ICD-10-CM | POA: Diagnosis not present

## 2023-06-09 DIAGNOSIS — N184 Chronic kidney disease, stage 4 (severe): Secondary | ICD-10-CM | POA: Insufficient documentation

## 2023-06-09 DIAGNOSIS — Z79899 Other long term (current) drug therapy: Secondary | ICD-10-CM | POA: Insufficient documentation

## 2023-06-09 DIAGNOSIS — E213 Hyperparathyroidism, unspecified: Secondary | ICD-10-CM | POA: Insufficient documentation

## 2023-06-09 DIAGNOSIS — K5730 Diverticulosis of large intestine without perforation or abscess without bleeding: Secondary | ICD-10-CM | POA: Diagnosis not present

## 2023-06-09 DIAGNOSIS — N200 Calculus of kidney: Secondary | ICD-10-CM | POA: Diagnosis not present

## 2023-06-09 DIAGNOSIS — G4730 Sleep apnea, unspecified: Secondary | ICD-10-CM | POA: Insufficient documentation

## 2023-06-09 DIAGNOSIS — Z9221 Personal history of antineoplastic chemotherapy: Secondary | ICD-10-CM | POA: Diagnosis not present

## 2023-06-09 DIAGNOSIS — N132 Hydronephrosis with renal and ureteral calculous obstruction: Secondary | ICD-10-CM | POA: Insufficient documentation

## 2023-06-09 DIAGNOSIS — Z6841 Body Mass Index (BMI) 40.0 and over, adult: Secondary | ICD-10-CM | POA: Insufficient documentation

## 2023-06-09 HISTORY — DX: Gastro-esophageal reflux disease without esophagitis: K21.9

## 2023-06-09 HISTORY — DX: Atherosclerosis of aorta: I70.0

## 2023-06-09 HISTORY — DX: Spondylolysis, cervical region: M43.02

## 2023-06-09 HISTORY — DX: Chronic kidney disease, unspecified: D63.1

## 2023-06-09 HISTORY — DX: Rheumatic mitral stenosis: I05.0

## 2023-06-09 HISTORY — DX: Anemia in chronic kidney disease: N18.9

## 2023-06-09 HISTORY — DX: Bifascicular block: I45.2

## 2023-06-09 HISTORY — DX: Type 2 diabetes mellitus without complications: E11.9

## 2023-06-09 HISTORY — DX: Cataract extraction status, right eye: Z98.41

## 2023-06-09 HISTORY — PX: CYSTOSCOPY/URETEROSCOPY/HOLMIUM LASER/STENT PLACEMENT: SHX6546

## 2023-06-09 HISTORY — DX: Left bundle-branch block, unspecified: I44.7

## 2023-06-09 HISTORY — DX: Chronic kidney disease, stage 4 (severe): N18.4

## 2023-06-09 HISTORY — DX: Lymphedema, not elsewhere classified: I89.0

## 2023-06-09 HISTORY — DX: Unspecified diastolic (congestive) heart failure: I50.30

## 2023-06-09 LAB — GLUCOSE, CAPILLARY: Glucose-Capillary: 114 mg/dL — ABNORMAL HIGH (ref 70–99)

## 2023-06-09 SURGERY — CYSTOSCOPY/URETEROSCOPY/HOLMIUM LASER/STENT PLACEMENT
Anesthesia: General | Site: Ureter | Laterality: Left

## 2023-06-09 MED ORDER — DEXAMETHASONE SODIUM PHOSPHATE 10 MG/ML IJ SOLN
INTRAMUSCULAR | Status: AC
  Filled 2023-06-09: qty 1

## 2023-06-09 MED ORDER — PHENYLEPHRINE 80 MCG/ML (10ML) SYRINGE FOR IV PUSH (FOR BLOOD PRESSURE SUPPORT)
PREFILLED_SYRINGE | INTRAVENOUS | Status: AC
  Filled 2023-06-09: qty 10

## 2023-06-09 MED ORDER — ROCURONIUM BROMIDE 100 MG/10ML IV SOLN
INTRAVENOUS | Status: DC | PRN
  Administered 2023-06-09: 50 mg via INTRAVENOUS

## 2023-06-09 MED ORDER — PHENYLEPHRINE 80 MCG/ML (10ML) SYRINGE FOR IV PUSH (FOR BLOOD PRESSURE SUPPORT)
PREFILLED_SYRINGE | INTRAVENOUS | Status: DC | PRN
  Administered 2023-06-09: 160 ug via INTRAVENOUS
  Administered 2023-06-09: 80 ug via INTRAVENOUS

## 2023-06-09 MED ORDER — CEFAZOLIN SODIUM-DEXTROSE 2-4 GM/100ML-% IV SOLN
INTRAVENOUS | Status: AC
  Filled 2023-06-09: qty 100

## 2023-06-09 MED ORDER — SUGAMMADEX SODIUM 200 MG/2ML IV SOLN
INTRAVENOUS | Status: DC | PRN
  Administered 2023-06-09: 200 mg via INTRAVENOUS

## 2023-06-09 MED ORDER — ACETAMINOPHEN 10 MG/ML IV SOLN
INTRAVENOUS | Status: AC
  Filled 2023-06-09: qty 100

## 2023-06-09 MED ORDER — ACETAMINOPHEN 10 MG/ML IV SOLN
INTRAVENOUS | Status: DC | PRN
  Administered 2023-06-09: 1000 mg via INTRAVENOUS

## 2023-06-09 MED ORDER — CHLORHEXIDINE GLUCONATE 0.12 % MT SOLN
OROMUCOSAL | Status: AC
  Filled 2023-06-09: qty 15

## 2023-06-09 MED ORDER — STERILE WATER FOR IRRIGATION IR SOLN
Status: DC | PRN
Start: 2023-06-09 — End: 2023-06-09
  Administered 2023-06-09: 500 mL

## 2023-06-09 MED ORDER — ORAL CARE MOUTH RINSE: 15.0000 mL | Freq: Once | OROMUCOSAL | Status: AC

## 2023-06-09 MED ORDER — FENTANYL CITRATE (PF) 100 MCG/2ML IJ SOLN
INTRAMUSCULAR | Status: AC
  Filled 2023-06-09: qty 2

## 2023-06-09 MED ORDER — PROPOFOL 10 MG/ML IV BOLUS
INTRAVENOUS | Status: DC | PRN
  Administered 2023-06-09: 100 mg via INTRAVENOUS
  Administered 2023-06-09: 50 mg via INTRAVENOUS

## 2023-06-09 MED ORDER — LACTATED RINGERS IV SOLN: INTRAVENOUS | Status: DC

## 2023-06-09 MED ORDER — IOHEXOL 180 MG/ML  SOLN
INTRAMUSCULAR | Status: DC | PRN
  Administered 2023-06-09: 10 mL

## 2023-06-09 MED ORDER — FENTANYL CITRATE (PF) 100 MCG/2ML IJ SOLN
INTRAMUSCULAR | Status: DC | PRN
  Administered 2023-06-09: 50 ug via INTRAVENOUS

## 2023-06-09 MED ORDER — ONDANSETRON HCL 4 MG/2ML IJ SOLN
INTRAMUSCULAR | Status: DC | PRN
  Administered 2023-06-09: 4 mg via INTRAVENOUS

## 2023-06-09 MED ORDER — LIDOCAINE HCL (CARDIAC) PF 100 MG/5ML IV SOSY
PREFILLED_SYRINGE | INTRAVENOUS | Status: DC | PRN
  Administered 2023-06-09: 60 mg via INTRAVENOUS

## 2023-06-09 MED ORDER — LIDOCAINE HCL (PF) 2 % IJ SOLN
INTRAMUSCULAR | Status: AC
  Filled 2023-06-09: qty 5

## 2023-06-09 MED ORDER — DEXAMETHASONE SODIUM PHOSPHATE 10 MG/ML IJ SOLN
INTRAMUSCULAR | Status: DC | PRN
  Administered 2023-06-09: 5 mg via INTRAVENOUS

## 2023-06-09 MED ORDER — PROPOFOL 10 MG/ML IV BOLUS
INTRAVENOUS | Status: AC
  Filled 2023-06-09: qty 20

## 2023-06-09 MED ORDER — SODIUM CHLORIDE 0.9 % IR SOLN
Status: DC | PRN
  Administered 2023-06-09: 3000 mL

## 2023-06-09 MED ORDER — CHLORHEXIDINE GLUCONATE 0.12 % MT SOLN
15.0000 mL | Freq: Once | OROMUCOSAL | Status: AC
  Administered 2023-06-09: 15 mL via OROMUCOSAL

## 2023-06-09 MED ORDER — ONDANSETRON HCL 4 MG/2ML IJ SOLN
INTRAMUSCULAR | Status: AC
  Filled 2023-06-09: qty 2

## 2023-06-09 MED ORDER — CEFAZOLIN SODIUM-DEXTROSE 2-4 GM/100ML-% IV SOLN
2.0000 g | INTRAVENOUS | Status: AC
  Administered 2023-06-09: 2 g via INTRAVENOUS

## 2023-06-09 MED ORDER — SEVOFLURANE IN SOLN
RESPIRATORY_TRACT | Status: AC
  Filled 2023-06-09: qty 250

## 2023-06-09 SURGICAL SUPPLY — 24 items
BAG DRAIN SIEMENS DORNER NS (MISCELLANEOUS) ×1 IMPLANT
BASKET LASER NITINOL 1.9FR (BASKET) IMPLANT
BASKET ZERO TIP 1.9FR (BASKET) IMPLANT
BRUSH SCRUB EZ 1% IODOPHOR (MISCELLANEOUS) ×1 IMPLANT
CATH URET FLEX-TIP 2 LUMEN 10F (CATHETERS) IMPLANT
CATH URETL OPEN END 6X70 (CATHETERS) IMPLANT
CNTNR URN SCR LID CUP LEK RST (MISCELLANEOUS) IMPLANT
DRAPE UTILITY 15X26 TOWEL STRL (DRAPES) ×1 IMPLANT
FIBER LASER MOSES 200 DFL (Laser) IMPLANT
GLOVE BIOGEL PI IND STRL 7.5 (GLOVE) ×1 IMPLANT
GOWN STRL REUS W/ TWL LRG LVL3 (GOWN DISPOSABLE) ×1 IMPLANT
GOWN STRL REUS W/ TWL XL LVL3 (GOWN DISPOSABLE) ×1 IMPLANT
GUIDEWIRE STR DUAL SENSOR (WIRE) ×1 IMPLANT
KIT TURNOVER CYSTO (KITS) ×1 IMPLANT
PACK CYSTO AR (MISCELLANEOUS) ×1 IMPLANT
SET CYSTO W/LG BORE CLAMP LF (SET/KITS/TRAYS/PACK) ×1 IMPLANT
SHEATH NAVIGATOR HD 12/14X36 (SHEATH) IMPLANT
SOL .9 NS 3000ML IRR UROMATIC (IV SOLUTION) ×1 IMPLANT
STENT URET 6FRX22 CONTOUR (STENTS) IMPLANT
STENT URET 6FRX24 CONTOUR (STENTS) IMPLANT
STENT URET 6FRX26 CONTOUR (STENTS) IMPLANT
SURGILUBE 2OZ TUBE FLIPTOP (MISCELLANEOUS) ×1 IMPLANT
VALVE UROSEAL ADJ ENDO (VALVE) IMPLANT
WATER STERILE IRR 500ML POUR (IV SOLUTION) ×1 IMPLANT

## 2023-06-09 NOTE — Anesthesia Postprocedure Evaluation (Signed)
 Anesthesia Post Note  Patient: Veronica Graham  Procedure(s) Performed: CYSTOSCOPY/URETEROSCOPY/HOLMIUM LASER/STENT PLACEMENT (Left: Ureter)  Patient location during evaluation: PACU Anesthesia Type: General Level of consciousness: awake and alert Pain management: pain level controlled Vital Signs Assessment: post-procedure vital signs reviewed and stable Respiratory status: spontaneous breathing, nonlabored ventilation, respiratory function stable and patient connected to nasal cannula oxygen Cardiovascular status: blood pressure returned to baseline and stable Postop Assessment: no apparent nausea or vomiting Anesthetic complications: no   No notable events documented.   Last Vitals:  Vitals:   06/09/23 0945 06/09/23 1004  BP: 131/86 (!) 141/72  Pulse: 68 62  Resp: 15 16  Temp: 36.8 C   SpO2: 97% 96%    Last Pain:  Vitals:   06/09/23 1004  TempSrc:   PainSc: 0-No pain                 Vanice Genre

## 2023-06-09 NOTE — Anesthesia Procedure Notes (Signed)
 Procedure Name: Intubation Date/Time: 06/09/2023 8:27 AM  Performed by: Annamarie Kid, CRNAPre-anesthesia Checklist: Patient identified, Emergency Drugs available, Suction available and Patient being monitored Patient Re-evaluated:Patient Re-evaluated prior to induction Oxygen Delivery Method: Circle system utilized Preoxygenation: Pre-oxygenation with 100% oxygen Induction Type: IV induction Ventilation: Mask ventilation without difficulty Laryngoscope Size: McGrath and 3 Grade View: Grade II Tube type: Oral Tube size: 7.0 mm Number of attempts: 1 Airway Equipment and Method: Stylet and Oral airway Placement Confirmation: ETT inserted through vocal cords under direct vision, positive ETCO2 and breath sounds checked- equal and bilateral Secured at: 21 cm Tube secured with: Tape Dental Injury: Teeth and Oropharynx as per pre-operative assessment

## 2023-06-09 NOTE — Interval H&P Note (Signed)
 History and Physical Interval Note:  06/09/2023 7:24 AM  Veronica Graham  has presented today for surgery, with the diagnosis of Left Ureteral Stone.  The various methods of treatment have been discussed with the patient and family. After consideration of risks, benefits and other options for treatment, the patient has consented to  Procedure(s): CYSTOSCOPY/URETEROSCOPY/HOLMIUM LASER/STENT PLACEMENT (Left) as a surgical intervention.  The patient's history has been reviewed, patient examined, no change in status, stable for surgery.  I have reviewed the patient's chart and labs.  Questions were answered to the patient's satisfaction.    Status post left ureteral stent placement 05/12/23 for an obstructing 7 mm left distal ureteral calculus with infection.  She presents today for definitive stone treatment.  CV: RRR Lungs: Clear   Lem Peary C Jacquel Mccamish

## 2023-06-09 NOTE — Anesthesia Preprocedure Evaluation (Signed)
 Anesthesia Evaluation  Patient identified by MRN, date of birth, ID band Patient awake    Reviewed: Allergy & Precautions, NPO status , Patient's Chart, lab work & pertinent test results  History of Anesthesia Complications Negative for: history of anesthetic complications  Airway Mallampati: III  TM Distance: <3 FB Neck ROM: full    Dental  (+) Chipped, Poor Dentition, Missing, Dental Advidsory Given   Pulmonary neg shortness of breath, asthma , sleep apnea , neg recent URI   Pulmonary exam normal        Cardiovascular hypertension, (-) angina +CHF  (-) Past MI and (-) Cardiac Stents Normal cardiovascular exam+ dysrhythmias (LBBB) (-) Valvular Problems/Murmurs     Neuro/Psych  Headaches, neg Seizures PSYCHIATRIC DISORDERS Anxiety Depression       GI/Hepatic Neg liver ROS,GERD  ,,  Endo/Other  diabetes  Class 3 obesity  Renal/GU Renal disease  negative genitourinary   Musculoskeletal   Abdominal   Peds  Hematology negative hematology ROS (+)   Anesthesia Other Findings Patient is NPO appropriate and reports no nausea or vomiting today.  Past Medical History: No date: Arthritis No date: Asthma 2003: Breast cancer (HCC)     Comment:  right breast No date: Breast cancer (HCC) No date: CHF (congestive heart failure) (HCC) No date: Chronic kidney disease No date: Depression No date: Environmental and seasonal allergies No date: HOH (hard of hearing) No date: Hypertension No date: Lower extremity edema No date: Obesity 2003: Personal history of chemotherapy     Comment:  Rt. breast No date: Sinus headache No date: Sleep apnea     Comment:  does not use CPAP  Past Surgical History: No date: APPENDECTOMY 2003: BREAST EXCISIONAL BIOPSY; Right     Comment:  positve No date: BTL 03/31/2017: CATARACT EXTRACTION W/PHACO; Right     Comment:  Procedure: CATARACT EXTRACTION PHACO AND INTRAOCULAR                LENS PLACEMENT (IOC);  Surgeon: Clair Crews, MD;                Location: [facility] ORS;  Service: Ophthalmology;  Laterality:              Right;  US  00:38.2 AP% 18.2 CDE 6.95 Fluid Pcak lot #               [id number] 04/22/2017: CATARACT EXTRACTION W/PHACO; Left     Comment:  Procedure: CATARACT EXTRACTION PHACO AND INTRAOCULAR               LENS PLACEMENT (IOC);  Surgeon: Clair Crews, MD;                Location: [facility] ORS;  Service: Ophthalmology;  Laterality:              Left;  US  00:39.0 AP% 12.0 CDE 4.68 Fluid Pack Lot #               [id number] No date: CHOLECYSTECTOMY 12/18/2021: COLONOSCOPY; N/A     Comment:  Procedure: COLONOSCOPY;  Surgeon: Toledo, Alphonsus Jeans, MD;              Location: [facility] END[facility]COPY;  Service: Gastroenterology;                Laterality: N/A; 12/18/2021: ESOPHAGOGASTRODUODENOSCOPY; N/A     Comment:  Procedure: ESOPHAGOGASTRODUODENOSCOPY (EGD);  Surgeon:               Corky Diener, Teodoro K, MD;  Location:  [facility] ENDOSCOPY;                Service: Gastroenterology;  Laterality: N/A; No date: EYE SURGERY No date: GASTRIC BYPASS No date: JOINT REPLACEMENT No date: MASTECTOMY, PARTIAL No date: REPLACEMENT TOTAL KNEE BILATERAL No date: TUBAL LIGATION     Reproductive/Obstetrics negative OB ROS                             Anesthesia Physical Anesthesia Plan  ASA: 3  Anesthesia Plan: General   Post-op Pain Management:    Induction: Intravenous  PONV Risk Score and Plan: Ondansetron , Dexamethasone and Treatment may vary due to age or medical condition  Airway Management Planned: LMA  Additional Equipment:   Intra-op Plan:   Post-operative Plan: Extubation in OR  Informed Consent: I have reviewed the patients History and Physical, chart, labs and discussed the procedure including the risks, benefits and alternatives for the proposed anesthesia with the patient or authorized representative who has indicated his/her  understanding and acceptance.     Dental Advisory Given  Plan Discussed with: Anesthesiologist, CRNA and Surgeon  Anesthesia Plan Comments: (Patient consented for risks of anesthesia including but not limited to:  - adverse reactions to medications - risk of airway placement if required - damage to eyes, teeth, lips or other oral mucosa - nerve damage due to positioning  - sore throat or hoarseness - Damage to heart, brain, nerves, lungs, other parts of body or loss of life  Patient voiced understanding and assent.)       Anesthesia Quick Evaluation

## 2023-06-09 NOTE — Transfer of Care (Signed)
 Immediate Anesthesia Transfer of Care Note  Patient: Veronica Graham  Procedure(s) Performed: CYSTOSCOPY/URETEROSCOPY/HOLMIUM LASER/STENT PLACEMENT (Left: Ureter)  Patient Location: PACU  Anesthesia Type:General  Level of Consciousness: drowsy  Airway & Oxygen Therapy: Patient Spontanous Breathing and Patient connected to face mask oxygen  Post-op Assessment: Report given to RN and Post -op Vital signs reviewed and stable  Post vital signs: stable  Last Vitals:  Vitals Value Taken Time  BP 114/58 06/09/23 0917  Temp    Pulse 73 06/09/23 0919  Resp 18 06/09/23 0919  SpO2 100 % 06/09/23 0919  Vitals shown include unfiled device data.  Last Pain:  Vitals:   06/09/23 0626  TempSrc: Temporal  PainSc: 0-No pain         Complications: No notable events documented.

## 2023-06-09 NOTE — Discharge Instructions (Signed)
 DISCHARGE INSTRUCTIONS FOR KIDNEY STONE/URETERAL STENT   MEDICATIONS:  1. Resume all your other meds from home.  2.  AZO (over-the-counter) can help with the burning/stinging when you urinate.  ACTIVITY:  1. May resume regular activities in 24 hours. 2. No driving while on narcotic pain medications  3. Drink plenty of water  4. Continue to walk at home - you can still get blood clots when you are at home, so keep active, but don't over do it.  5. May return to work/school tomorrow or when you feel ready   BATHING:  1. You have a string coming from your urethra: The stent string is attached to your ureteral stent. Do not pull on this.   SIGNS/SYMPTOMS TO CALL:  Common postoperative symptoms include urinary frequency, urgency, bladder spasm and blood in the urine  Please call us  if you have a fever greater than 101.5, uncontrolled nausea/vomiting, uncontrolled pain, dizziness, unable to urinate, excessively bloody urine, chest pain, shortness of breath, leg swelling, leg pain, or any other concerns or questions.   You can reach us  at [phone].   FOLLOW-UP:  1. You will be contacted for a follow-up appointment 2. You have a string attached to your stent, you may remove it on Thursday, 06/11/2023. To do this, pull the string until the stent is completely removed. You may feel an odd sensation in your back.  If you prefer to have our office remove the stent please contact our office and a time will be arranged.

## 2023-06-09 NOTE — Op Note (Signed)
 Preoperative diagnosis:  Left distal ureteral calculus Left nephrolithiasis  Postoperative diagnosis:  Left nephrolithiasis  Procedure:  Cystoscopy Left ureteroscopy and stone removal Ureteroscopic laser lithotripsy Left ureteral stent exchange (155F/22 cm) Left retrograde pyelography with interpretation  Surgeon: Geralyn Knee C. Keela Rubert, M.D.  Anesthesia: General  Complications: None  Intraoperative findings:  Cystoscopy: Bladder mucosa without solid or papillary lesions; UOs normal-appearing bilaterally.  Inflammatory changes left UO secondary to indwelling stent Ureteroscopy: Previously noted left distal ureteral calculus no longer present.  10 mm nonobstructing lower calyceal calculus Left retrograde pyelography demonstrated a filling defect within the left lower pole calyx ureter consistent with the patient's known calculus without other abnormalities. Left retrograde pyelography post procedure showed no filling defects, stone fragments or contrast extravasation  EBL: Minimal  Specimens: Calculus fragments for analysis   Indication: Veronica Graham is a 78 y.o. female status post left ureteral stent placement/1/25 for an obstructing 7 mm left distal ureteral calculus with infection.  She presents today for definitive stone treatment.  She also has a nonobstructing 10 mm left lower pole calculus.  After reviewing the management options for treatment, the patient elected to proceed with the above surgical procedure(s). We have discussed the potential benefits and risks of the procedure, side effects of the proposed treatment, the likelihood of the patient achieving the goals of the procedure, and any potential problems that might occur during the procedure or recuperation. Informed consent has been obtained.  Description of procedure:  The patient was taken to the operating room and general anesthesia was induced.  The patient was placed in the dorsal lithotomy position, prepped and  draped in the usual sterile fashion, and preoperative antibiotics were administered. A preoperative time-out was performed.   A 21 French cystoscope was lubricated and placed per urethra.  Panendoscopy was performed with findings as described above  The left ureteral stent was grasped with endoscopic forceps and brought out through the urethral meatus.  The stent was cannulated with a 0.038 Sensor and advanced into the renal pelvis under fluoroscopic guidance.  After stent removal a 4.5 Fr semirigid ureteroscope was then advanced into the ureter next to the guidewire.  The ureteroscope was advanced to the UPJ and no ureteral calculus was identified.  The scope was withdrawn under direct vision with no abnormalities identified.  A single channel digital flexible ureteroscope was then inserted per urethra and easily advanced into the left ureter alongside the guidewire and advanced to the UPJ.  Retrograde pyelogram was performed through the ureteroscope with findings as described above.  All calyces were examined under fluoroscopic guidance with the 10 mm calculus identified in the lower pole calyx.  The stone was then dusted with a 200 m Moses holmium laser fiber at a setting of 0.3 J/80 hz.   A small fragment was placed in a 1.55F nitinol basket, removed and sent for analysis.  The ureteroscope was reinserted and retrograde pyelogram was performed with findings as described above.  All calyces were examined under fluoroscopic guidance and no significantly stone fragments were identified..   A 155F/22 cm Contour ureteral stent was placed under fluoroscopic guidance.  The wire was then removed with an adequate stent curl noted in the renal pelvis as well as in the bladder.  The tether was left attached to the stent, cut short, tied and tucked into the vagina.  The bladder was then emptied and the procedure ended.  The patient appeared to tolerate the procedure well and without complications.  After  anesthetic reversal the patient was transported to the PACU in stable condition.   Plan: Instructed to remove her stent on Thursday, 06/11/2023 Postop follow-up ~ 1 month   Darlynn Elam, MD

## 2023-06-10 ENCOUNTER — Encounter: Payer: Self-pay | Admitting: Urology

## 2023-06-17 LAB — STONE ANALYSIS
Calcium Oxalate Monohydrate: 10 %
Calcium Phosphate (Hydroxyl): 90 %
Weight Calculi: 4 mg

## 2023-06-25 ENCOUNTER — Ambulatory Visit
Admission: RE | Admit: 2023-06-25 | Discharge: 2023-06-25 | Disposition: A | Discharge status: Home / Self Care | Source: Ambulatory Visit | Attending: Nurse Practitioner | Admitting: Nurse Practitioner

## 2023-06-25 DIAGNOSIS — I454 Nonspecific intraventricular block: Secondary | ICD-10-CM | POA: Insufficient documentation

## 2023-06-25 DIAGNOSIS — I10 Essential (primary) hypertension: Secondary | ICD-10-CM | POA: Diagnosis present

## 2023-06-25 DIAGNOSIS — I5032 Chronic diastolic (congestive) heart failure: Secondary | ICD-10-CM | POA: Insufficient documentation

## 2023-06-25 DIAGNOSIS — I4891 Unspecified atrial fibrillation: Secondary | ICD-10-CM | POA: Insufficient documentation

## 2023-06-25 MED ORDER — REGADENOSON 0.4 MG/5ML IV SOLN
0.4000 mg | Freq: Once | INTRAVENOUS | Status: AC
  Administered 2023-06-25: 0.4 mg via INTRAVENOUS

## 2023-06-25 MED ORDER — TECHNETIUM TC 99M TETROFOSMIN IV KIT
29.0400 | PACK | Freq: Once | INTRAVENOUS | Status: AC | PRN
  Administered 2023-06-25: 29.04 via INTRAVENOUS

## 2023-06-25 MED ORDER — TECHNETIUM TC 99M TETROFOSMIN IV KIT
10.4200 | PACK | Freq: Once | INTRAVENOUS | Status: AC | PRN
  Administered 2023-06-25: 10.42 via INTRAVENOUS

## 2023-07-01 LAB — NM MYOCAR MULTI W/SPECT W/WALL MOTION / EF
Base ST Depression (mm): 0 mm
Estimated workload: 1
Exercise duration (min): 1 min
Exercise duration (sec): 0 s
LV dias vol: 96 mL (ref 46–106)
LV sys vol: 47 mL
MPHR: 143 {beats}/min
Nuc Stress EF: 51 %
Peak HR: 89 {beats}/min
Percent HR: 62 %
Rest HR: 75 {beats}/min
Rest Nuclear Isotope Dose: 10.4 mCi
SDS: 0
SRS: 9
SSS: 1
Stress Nuclear Isotope Dose: 29 mCi
TID: 1.04

## 2023-07-10 ENCOUNTER — Ambulatory Visit: Admitting: Urology

## 2023-07-10 ENCOUNTER — Encounter: Payer: Self-pay | Admitting: Urology

## 2023-07-10 VITALS — BP 125/86 | HR 84 | Ht 62.0 in | Wt 244.0 lb

## 2023-07-10 DIAGNOSIS — N201 Calculus of ureter: Secondary | ICD-10-CM

## 2023-07-10 DIAGNOSIS — Z09 Encounter for follow-up examination after completed treatment for conditions other than malignant neoplasm: Secondary | ICD-10-CM

## 2023-07-10 NOTE — Patient Instructions (Signed)

## 2023-07-10 NOTE — Progress Notes (Signed)
 I, [name], acting as a Neurosurgeon for Veronica Knapp, MD., have documented all relevant documentation on the behalf of Veronica Knapp, MD, as directed by Veronica Knapp, MD while in the presence of Veronica Knapp, MD.  Discussed the use of AI scribe software for clinical note transcription with the patient, who gave verbal consent to proceed.   07/10/2023 2:25 PM   Veronica Graham Aug 16, 1945 [id number]  Referring provider: Nestor Banter, MD [phone] [address] [facility] [place],  [place] [address]  Chief Complaint  Patient presents with   Nephrolithiasis    HPI: Veronica Graham is a 78 y.o. female presents for post-op follow-up.  Underwent left ureteral stent placement 05/12/23 for an obstructing 7 mm left ureteral calculus with infection Status post left ureteroscopic stone removal 06/09/23. She had no post-operative complications and removed her stent via the tether on 06/11/23.  Denies flank, abdominal, or pelvic pain No bothersome lower urinary tract symptoms.  Stone analysis was 10% calcium  oxalate monohydrate/90% calcium  phosphate (hydroxyl)   PMH: Past Medical History:  Diagnosis Date   (HFpEF) heart failure with preserved ejection fraction (HCC)    Anemia of chronic renal failure    Anxiety    a.) on BZO (alprazolam ) PRN   Aortic atherosclerosis (HCC)    Arthritis    Asthma    Atrial fibrillation (HCC) 06/02/2023   a.) noted on preoperative ECG 06/02/2023; b.) CHA2DS2-VASc Score = 7 (age x 2, sex, HFpEF, HTN, vascular disease, T2DM) as of 06/02/2023; b.) rate/rhythm maintained on oral carvedilol ; will start chronic OAC using apixaban following urology surgery on 06/09/2023   Bifascicular block (IRBBB + LAFB)    Breast cancer, right (HCC) 2003   a.) s/p mastectomy + systemic antineoplastic chemotherapy   Cervical spondylolysis    CKD (chronic kidney disease), stage IV (HCC)    Depression    Environmental and seasonal  allergies    GERD (gastroesophageal reflux disease)    History of bilateral cataract extraction    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/2004   History of kidney stones    HOH (hard of hearing)    Hypertension    LBBB (left bundle branch block)    Lymphedema of both lower extremities    Mitral stenosis    Obesity    Personal history of chemotherapy 2003   Recurrent UTI (urinary tract infection)    Secondary hyperparathyroidism of renal origin (HCC)    Severe sepsis (HCC)    Sinus headache    Sleep apnea    a.) non-compliant with nocturnal PAP therapy   T2DM (type 2 diabetes mellitus) (HCC)     Surgical History: Past Surgical History:  Procedure Laterality Date   APPENDECTOMY     BREAST EXCISIONAL BIOPSY Right 2003   positve   CATARACT EXTRACTION W/PHACO Right 03/31/2017   Procedure: CATARACT EXTRACTION PHACO AND INTRAOCULAR LENS PLACEMENT (IOC);  Surgeon: Clair Crews, MD;  Location: [facility];  Service: Ophthalmology;  Laterality: Right;  US  00:38.2 AP% 18.2 CDE 6.95 Fluid Pcak lot # [id number]   CATARACT EXTRACTION W/PHACO Left 04/22/2017   Procedure: CATARACT EXTRACTION PHACO AND INTRAOCULAR LENS PLACEMENT (IOC);  Surgeon: Clair Crews, MD;  Location: [facility];  Service: Ophthalmology;  Laterality: Left;  US  00:39.0 AP% 12.0 CDE 4.68 Fluid Pack Lot # [id number]   CHOLECYSTECTOMY     COLONOSCOPY N/A 12/18/2021   Procedure: COLONOSCOPY;  Surgeon: Corky Diener, Teodoro K, MD;  Location:  [facility];  Service: Gastroenterology;  Laterality: N/A;   CYSTOSCOPY W/ URETERAL STENT PLACEMENT Left 05/12/2023   Procedure: CYSTOSCOPY, WITH RETROGRADE PYELOGRAM AND URETERAL STENT INSERTION;  Surgeon: Veronica Knapp, MD;  Location: [facility];  Service: Urology;  Laterality: Left;   CYSTOSCOPY/URETEROSCOPY/HOLMIUM LASER/STENT PLACEMENT Left 06/09/2023   Procedure: CYSTOSCOPY/URETEROSCOPY/HOLMIUM LASER/STENT PLACEMENT;  Surgeon: Veronica Knapp, MD;  Location: [facility];  Service: Urology;   Laterality: Left;   ESOPHAGOGASTRODUODENOSCOPY N/A 12/18/2021   Procedure: ESOPHAGOGASTRODUODENOSCOPY (EGD);  Surgeon: Toledo, Alphonsus Jeans, MD;  Location: [facility];  Service: Gastroenterology;  Laterality: N/A;   GASTRIC BYPASS N/A 06/10/2004   JOINT REPLACEMENT     MASTECTOMY, PARTIAL     REPLACEMENT TOTAL KNEE BILATERAL     TUBAL LIGATION Bilateral     Home Medications:  Allergies as of 07/10/2023       Reactions   Other Other (See Comments)   DO NOT USE BLOOD PRESSURE CUFF OR NEEDLES (IVs) in RIGHT ARM        Medication List        Accurate as of Jul 10, 2023  2:25 PM. If you have any questions, ask your nurse or doctor.          STOP taking these medications    acetaminophen -codeine  300-30 MG tablet Commonly known as: TYLENOL  #3 Stopped by: Veronica Graham   Farxiga 10 MG Tabs tablet Generic drug: dapagliflozin propanediol Stopped by: Veronica Graham       TAKE these medications    albuterol  108 (90 Base) MCG/ACT inhaler Commonly known as: VENTOLIN  HFA Inhale 2 puffs into the lungs every 4 (four) hours as needed for wheezing or shortness of breath.   ALPRAZolam  0.5 MG tablet Commonly known as: XANAX  Take 0.5 mg by mouth at bedtime.   apixaban 2.5 MG Tabs tablet Commonly known as: ELIQUIS Take 2.5 mg by mouth 2 (two) times daily.   b complex vitamins capsule Take 1 capsule by mouth daily.   beclomethasone 40 MCG/ACT inhaler Commonly known as: QVAR Inhale 2 puffs into the lungs 2 (two) times daily.   buPROPion 150 MG 24 hr tablet Commonly known as: WELLBUTRIN XL Take 150 mg by mouth every morning.   calcitRIOL  0.25 MCG capsule Commonly known as: ROCALTROL  Take 0.25 mcg by mouth every morning.   carvedilol  3.125 MG tablet Commonly known as: COREG  Take 3.125 mg by mouth 2 (two) times daily.   diphenhydrAMINE 25 mg capsule Commonly known as: BENADRYL Take 50 mg by mouth every 6 (six) hours as needed.   famotidine 40 MG tablet Commonly  known as: PEPCID Take 40 mg by mouth at bedtime.   ferrous sulfate 325 (65 FE) MG EC tablet Take 325 mg by mouth daily with breakfast.   montelukast  10 MG tablet Commonly known as: SINGULAIR  Take 10 mg by mouth at bedtime.   omeprazole 20 MG capsule Commonly known as: PRILOSEC Take 20 mg by mouth 2 (two) times daily.   oxybutynin  5 MG 24 hr tablet Commonly known as: DITROPAN -XL Take 5 mg by mouth every morning.   valsartan 160 MG tablet Commonly known as: DIOVAN Take 160 mg by mouth every morning.   venlafaxine  XR 150 MG 24 hr capsule Commonly known as: EFFEXOR -XR Take 150 mg by mouth daily with breakfast.   VITAMIN D-3 PO Take 1 tablet by mouth daily at 6 (six) AM.        Allergies:  Allergies  Allergen Reactions   Other Other (See Comments)  DO NOT USE BLOOD PRESSURE CUFF OR NEEDLES (IVs) in RIGHT ARM    Family History: Family History  Problem Relation Age of Onset   Heart failure Mother    COPD Father    CAD Brother     Social History:  reports that she has never smoked. She has never used smokeless tobacco. She reports that she does not drink alcohol and does not use drugs.   Physical Exam: BP 125/86   Pulse 84   Ht 5\' 2"  (1.575 m)   Wt 244 lb (110.7 kg)   LMP  (LMP Unknown)   BMI 44.63 kg/m   Constitutional:  Alert and oriented, No acute distress. HEENT: [place] AT Respiratory: Normal respiratory effort, no increased work of breathing. Psychiatric: Normal mood and affect.   Assessment & Plan:    1. Status post left ureteroscopic stone removal Patient is doing well post-operatively. Stone analysis showed 90% calcium  phosphate composition. Recommended a metabolic evaluation via [facility], including a 24-hour urine collection and blood work. Patient agrees with the plan and will be contacted with results.  I have reviewed the above documentation for accuracy and completeness, and I agree with the above.   Veronica Knapp, MD  [facility]  [facility] [address] [place], [place] [address] [phone]

## 2023-10-16 ENCOUNTER — Other Ambulatory Visit (HOSPITAL_COMMUNITY): Payer: Self-pay

## 2023-10-16 ENCOUNTER — Other Ambulatory Visit: Payer: Self-pay | Admitting: Family Medicine

## 2023-10-16 DIAGNOSIS — Z1231 Encounter for screening mammogram for malignant neoplasm of breast: Secondary | ICD-10-CM

## 2023-11-12 ENCOUNTER — Ambulatory Visit
Admission: RE | Admit: 2023-11-12 | Discharge: 2023-11-12 | Disposition: A | Discharge status: Home / Self Care | Source: Ambulatory Visit | Attending: Family Medicine | Admitting: Family Medicine

## 2023-11-12 DIAGNOSIS — Z1231 Encounter for screening mammogram for malignant neoplasm of breast: Secondary | ICD-10-CM | POA: Diagnosis present

## 2023-11-17 ENCOUNTER — Other Ambulatory Visit: Payer: Self-pay | Admitting: Family Medicine

## 2023-11-17 DIAGNOSIS — R928 Other abnormal and inconclusive findings on diagnostic imaging of breast: Secondary | ICD-10-CM

## 2023-11-23 ENCOUNTER — Ambulatory Visit
Admission: RE | Admit: 2023-11-23 | Discharge: 2023-11-23 | Disposition: A | Source: Ambulatory Visit | Attending: Family Medicine | Admitting: Family Medicine

## 2023-11-23 ENCOUNTER — Ambulatory Visit
Admission: RE | Admit: 2023-11-23 | Discharge: 2023-11-23 | Disposition: A | Discharge status: Home / Self Care | Source: Ambulatory Visit | Attending: Family Medicine | Admitting: Family Medicine

## 2023-11-23 DIAGNOSIS — R928 Other abnormal and inconclusive findings on diagnostic imaging of breast: Secondary | ICD-10-CM | POA: Diagnosis present

## 2023-11-24 ENCOUNTER — Other Ambulatory Visit: Payer: Self-pay | Admitting: Family Medicine

## 2023-11-24 DIAGNOSIS — R928 Other abnormal and inconclusive findings on diagnostic imaging of breast: Secondary | ICD-10-CM

## 2023-12-03 ENCOUNTER — Ambulatory Visit
Admission: RE | Admit: 2023-12-03 | Discharge: 2023-12-03 | Disposition: A | Discharge status: Home / Self Care | Source: Ambulatory Visit | Attending: Family Medicine | Admitting: Family Medicine

## 2023-12-03 DIAGNOSIS — R928 Other abnormal and inconclusive findings on diagnostic imaging of breast: Secondary | ICD-10-CM

## 2023-12-03 HISTORY — PX: BREAST BIOPSY: SHX20

## 2023-12-03 MED ORDER — LIDOCAINE-EPINEPHRINE 1 %-1:100000 IJ SOLN
20.0000 mL | Freq: Once | INTRAMUSCULAR | Status: AC
  Administered 2023-12-03: 20 mL
  Filled 2023-12-03: qty 20

## 2023-12-03 MED ORDER — LIDOCAINE 1 % OPTIME INJ - NO CHARGE
5.0000 mL | Freq: Once | INTRAMUSCULAR | Status: AC
  Administered 2023-12-03: 5 mL
  Filled 2023-12-03: qty 6

## 2023-12-07 LAB — SURGICAL PATHOLOGY
